# Patient Record
Sex: Male | Born: 1990 | Race: White | Hispanic: No | Marital: Single | Smoking: Never smoker
Health system: Southern US, Community
[De-identification: ages and names within clinical notes are randomized; demographics above are authoritative.]

## PROBLEM LIST (undated history)

## (undated) DIAGNOSIS — E785 Hyperlipidemia, unspecified: Secondary | ICD-10-CM

## (undated) DIAGNOSIS — L0292 Furuncle, unspecified: Secondary | ICD-10-CM

## (undated) DIAGNOSIS — E039 Hypothyroidism, unspecified: Secondary | ICD-10-CM

## (undated) DIAGNOSIS — Z0000 Encounter for general adult medical examination without abnormal findings: Secondary | ICD-10-CM

## (undated) DIAGNOSIS — Q909 Down syndrome, unspecified: Secondary | ICD-10-CM

## (undated) DIAGNOSIS — Q249 Congenital malformation of heart, unspecified: Secondary | ICD-10-CM

## (undated) DIAGNOSIS — L0293 Carbuncle, unspecified: Secondary | ICD-10-CM

## (undated) DIAGNOSIS — R5381 Other malaise: Secondary | ICD-10-CM

## (undated) DIAGNOSIS — R5383 Other fatigue: Secondary | ICD-10-CM

## (undated) HISTORY — PX: LUNG SURGERY: SHX703

## (undated) HISTORY — DX: Hypothyroidism, unspecified: E03.9

## (undated) HISTORY — DX: Hyperlipidemia, unspecified: E78.5

## (undated) HISTORY — DX: Other fatigue: R53.83

## (undated) HISTORY — DX: Down syndrome, unspecified: Q90.9

## (undated) HISTORY — DX: Carbuncle, unspecified: L02.93

## (undated) HISTORY — DX: Encounter for general adult medical examination without abnormal findings: Z00.00

## (undated) HISTORY — PX: HERNIA REPAIR: SHX51

## (undated) HISTORY — DX: Furuncle, unspecified: L02.92

## (undated) HISTORY — DX: Congenital malformation of heart, unspecified: Q24.9

## (undated) HISTORY — PX: CARDIAC SURGERY: SHX584

## (undated) HISTORY — DX: Other malaise: R53.81

---
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
## 2010-02-07 ENCOUNTER — Encounter: Payer: Self-pay | Admitting: Family Medicine

## 2010-03-13 ENCOUNTER — Ambulatory Visit: Payer: Self-pay | Admitting: Family Medicine

## 2010-03-19 ENCOUNTER — Ambulatory Visit: Payer: Self-pay | Admitting: Family Medicine

## 2010-03-22 ENCOUNTER — Ambulatory Visit: Payer: Self-pay | Admitting: Family Medicine

## 2010-03-22 DIAGNOSIS — J020 Streptococcal pharyngitis: Secondary | ICD-10-CM | POA: Insufficient documentation

## 2010-04-02 ENCOUNTER — Ambulatory Visit: Payer: Self-pay | Admitting: Family Medicine

## 2010-04-02 DIAGNOSIS — R5381 Other malaise: Secondary | ICD-10-CM | POA: Insufficient documentation

## 2010-04-02 DIAGNOSIS — R5383 Other fatigue: Secondary | ICD-10-CM

## 2010-04-02 HISTORY — DX: Other malaise: R53.81

## 2010-06-24 ENCOUNTER — Encounter: Payer: Self-pay | Admitting: Family Medicine

## 2010-06-26 ENCOUNTER — Telehealth: Payer: Self-pay | Admitting: Family Medicine

## 2010-07-03 NOTE — Assessment & Plan Note (Signed)
Summary: boil//ccm   Vital Signs:  Patient profile:   20 year old male Height:      59.5 inches Weight:      146 pounds BMI:     29.10 Temp:     98.7 degrees F oral BP sitting:   102 / 72  (left arm) Cuff size:   regular  Vitals Entered By: Kern Reap CMA Duncan Dull) (August 07, 2009 8:52 AM)  Reason for Visit boin on inner left thigh  History of Present Illness: Grant [name] is an 20 year old male, who comes in today accompanied by his mother Grant [name] for evaluation of boils on the underside of his left leg x 2 weeks.  She noticed this about two weeks ago.  Recently, the beginning, more red more swollen.  There are not draining pus.  No systemic symptoms  Allergies: No Known Drug Allergies  Past History:  Past medical, surgical, family and social histories (including risk factors) reviewed for relevance to current acute and chronic problems.  Past Medical History: Reviewed history from 04/01/2008 and no changes required. Pneumonia, hx of Down's syndrome  Past Surgical History: Reviewed history from 04/01/2008 and no changes required. Inguinal herniorrhaphy Tonsillectomy open-heart surgery at [age] year of age.  Gore-Tex graft in the ventricle details to follow circumcision facial surgery for severe laceration lung abscess 2000 6 requiring decortication and chest tube at [facility]  Family History: Reviewed history from 04/01/2008 and no changes required. mother in good health.  No problems except for penicillin allergy father in good health one sister, who is autistic  Social History: Reviewed history from 04/01/2008 and no changes required. Single Never Smoked Alcohol use-no Drug use-no Regular exercise-no freshman at [employer]  Review of Systems      See HPI  Physical Exam  General:  Well-developed,well-nourished,in no acute distress; alert,appropriate and cooperative throughout examination Skin:  boils x 3, inner side, left thigh   Impression &  Recommendations:  Problem # 1:  CARBUNCLE AND FURUNCLE OF UNSPECIFIED SITE (ICD-680.9) Assessment New  Orders: Prescription Created Electronically [phone])  Complete Medication List: 1)  Tamiflu 45 Mg Caps (Oseltamivir phosphate) .... Every morning x 7 days 2)  Keflex 500 Mg Caps (Cephalexin) .... 2 by mouth two times a day  Patient Instructions: 1)  begin Keflex 2 tabs  b.i.d. x 2 weeks.  If the lesions do not resolve, then let me know.   2)  cover in the evening with antibiotic ointment  and occlusive dressing Prescriptions: KEFLEX 500 MG CAPS (CEPHALEXIN) 2 by mouth two times a day  #56 x 1   Entered and Authorized by:   Roderick Pee MD   Signed by:   Roderick Pee MD on 08/07/2009   Method used:   Electronically to        [facility]* (retail)       [address] [place], [place]  [address]       Ph: [phone]       Fax: [phone]   RxID:   [id number]

## 2010-07-03 NOTE — Miscellaneous (Signed)
Summary: Order for Speech and Language Therapy/[employer]  Order for Speech and Language Therapy/[employer]   Imported By: Maryln Gottron 02/13/2010 12:43:21  _____________________________________________________________________  External Attachment:    Type:   Image     Comment:   External Document

## 2010-07-03 NOTE — Assessment & Plan Note (Signed)
Summary: flu shot/njr  Nurse Visit   Allergies: No Known Drug Allergies  Immunizations Administered:  Influenza Vaccine # 1:    Vaccine Type: Fluvax 3+    Site: right deltoid    Mfr: GlaxoSmithKline    Dose: 0.5 ml    Route: IM    Given by: Mervin Hack CMA (AAMA)    Exp. Date: 12/01/2010    Lot #: [id number]    VIS given: 12/26/09 version given March 13, 2010.  Flu Vaccine Consent Questions:    Do you have a history of severe allergic reactions to this vaccine? no    Any prior history of allergic reactions to egg and/or gelatin? no    Do you have a sensitivity to the preservative Thimersol? no    Do you have a past history of Guillan-Barre Syndrome? no    Do you currently have an acute febrile illness? no    Have you ever had a severe reaction to latex? no    Vaccine information given and explained to patient? yes  Orders Added: 1)  Flu Vaccine 25yrs + [90658] 2)  Admin 1st Vaccine [09811]

## 2010-07-03 NOTE — Assessment & Plan Note (Signed)
Summary: SORE THROAT // RS   Vital Signs:  Patient profile:   20 year old male Weight:      135 pounds Temp:     97.8 degrees F oral  Vitals Entered By: Kern Reap CMA Duncan Dull) (March 22, 2010 8:15 AM) CC: follow-up visit sore throat   CC:  follow-up visit sore throat.  History of Present Illness: Grant [name] is a 20 year old male, who comes in today accompanied by his mom for evaluation of a sore throat.  About a week ago he developed a febrile illness.  The fever lasted for a day or two and went away.  We saw him on Monday.  At that time.  His exam was negative.  Yesterday at school.  He became listless and mom picked him up early.  He said no fever, and is not complaining of anything.  Allergies: No Known Drug Allergies  Social History: Reviewed history from 04/01/2008 and no changes required. Single Never Smoked Alcohol use-no Drug use-no Regular exercise-no freshman at [employer]  Review of Systems      See HPI  Physical Exam  General:  Well-developed,well-nourished,in no acute distress; alert,appropriate and cooperative throughout examination Head:  Normocephalic and atraumatic without obvious abnormalities. No apparent alopecia or balding. Eyes:  No corneal or conjunctival inflammation noted. EOMI. Perrla. Funduscopic exam benign, without hemorrhages, exudates or papilledema. Vision grossly normal. Ears:  External ear exam shows no significant lesions or deformities.  Otoscopic examination reveals clear canals, tympanic membranes are intact bilaterally without bulging, retraction, inflammation or discharge. Hearing is grossly normal bilaterally. Nose:  External nasal examination shows no deformity or inflammation. Nasal mucosa are pink and moist without lesions or exudates. Mouth:  oral cavity normal.  There is erythema of the soft palate extending to both tonsils.  No pus Neck:  No deformities, masses, or tenderness noted. Lungs:  Normal respiratory  effort, chest expands symmetrically. Lungs are clear to auscultation, no crackles or wheezes.   Problems:  Medical Problems Added: 1)  Dx of Strep Throat  (ICD-034.0)  Impression & Recommendations:  Problem # 1:  STREP THROAT (ICD-034.0) Assessment New  Orders: Prescription Created Electronically [phone])  His updated medication list for this problem includes:    Amoxicillin 250 Mg/12ml Susr (Amoxicillin) .Marland Kitchen... 2 tsps in am, 1 tsp in pm  Complete Medication List: 1)  Hydromet 5-1.5 Mg/51ml Syrp (Hydrocodone-homatropine) .... 1/2 to 1 tsp at bedtime as needed 2)  Amoxicillin 250 Mg/15ml Susr (Amoxicillin) .... 2 tsps in am, 1 tsp in pm  Other Orders: Rapid Strep (13086)  Patient Instructions: 1)  amoxicillin 250 mg/teaspoon, directions 2 teaspoons in the morning, one at bedtime to bottle empty Prescriptions: AMOXICILLIN 250 MG/5ML SUSR (AMOXICILLIN) 2 tsps in am, 1 tsp in pm  #150 cc x 1   Entered and Authorized by:   Roderick Pee MD   Signed by:   Roderick Pee MD on 03/22/2010   Method used:   Electronically to        [facility]* (retail)       [address] [place], [place]  [address]       Ph: [phone]       Fax: [phone]   RxID:   [id number]    Orders Added: 1)  Rapid Strep [87564] 2)  Est. Patient Level II [33295] 3)  Prescription Created Electronically [G8553] 4)  Est. Patient Level III [16109]    Laboratory Results  Date/Time Received: March 22, 2010   Other Tests  Rapid Strep: positive Comments: Kern Reap CMA Duncan Dull)  March 22, 2010 8:25 AM

## 2010-07-03 NOTE — Letter (Signed)
Summary: Out of School  [place] [facility]  [address] [place], [place] [address]   Phone: [phone]  Fax: [phone]    April 02, 2010   Student:  Grant [name]    To Whom It May Concern:   For Medical reasons, please excuse the above named student from school for the following dates:  Start:   April 02, 2010  End:     If you need additional information, please feel free to contact our office.   Sincerely,    Kelle Darting, MD    ****This is a legal document and cannot be tampered with.  Schools are authorized to verify all information and to do so accordingly.

## 2010-07-03 NOTE — Assessment & Plan Note (Signed)
Summary: chest congestion/stuff nose/cjr   Vital Signs:  Patient profile:   20 year old male Height:      59 inches Weight:      143 pounds BMI:     28.99 Temp:     98.3 degrees F oral BP sitting:   102 / 72  (left arm)  Vitals Entered By: Kern Reap CMA Duncan Dull) (March 19, 2010 3:39 PM) CC: head congestion and sore throat   CC:  head congestion and sore throat.  History of Present Illness: Grant [name] is an 20 year old male with underlying Down syndrome, who comes in today with a 4-day history of fever, chills, and congestion.  Fever one away after 72 hours.  He is now afebrile feels normal.  Allergies: No Known Drug Allergies  Past History:  Past medical, surgical, family and social histories (including risk factors) reviewed for relevance to current acute and chronic problems.  Past Medical History: Reviewed history from 04/01/2008 and no changes required. Pneumonia, hx of Down's syndrome  Past Surgical History: Reviewed history from 04/01/2008 and no changes required. Inguinal herniorrhaphy Tonsillectomy open-heart surgery at [age] year of age.  Gore-Tex graft in the ventricle details to follow circumcision facial surgery for severe laceration lung abscess 2000 6 requiring decortication and chest tube at [facility]  Family History: Reviewed history from 04/01/2008 and no changes required. mother in good health.  No problems except for penicillin allergy father in good health one sister, who is autistic  Social History: Reviewed history from 04/01/2008 and no changes required. Single Never Smoked Alcohol use-no Drug use-no Regular exercise-no freshman at [employer]  Physical Exam  General:  Well-developed,well-nourished,in no acute distress; alert,appropriate and cooperative throughout examination Head:  Normocephalic and atraumatic without obvious abnormalities. No apparent alopecia or balding. Eyes:  No corneal or conjunctival inflammation  noted. EOMI. Perrla. Funduscopic exam benign, without hemorrhages, exudates or papilledema. Vision grossly normal. Ears:  External ear exam shows no significant lesions or deformities.  Otoscopic examination reveals clear canals, tympanic membranes are intact bilaterally without bulging, retraction, inflammation or discharge. Hearing is grossly normal bilaterally. Nose:  External nasal examination shows no deformity or inflammation. Nasal mucosa are pink and moist without lesions or exudates. Mouth:  Oral mucosa and oropharynx without lesions or exudates.  Teeth in good repair. Neck:  No deformities, masses, or tenderness noted. Chest Wall:  No deformities, masses, tenderness or gynecomastia noted. Breasts:  No masses or gynecomastia noted Lungs:  Normal respiratory effort, chest expands symmetrically. Lungs are clear to auscultation, no crackles or wheezes.   Problems:  Medical Problems Added: 1)  Dx of Viral Infection-unspec  (ICD-079.99)  Impression & Recommendations:  Problem # 1:  VIRAL INFECTION-UNSPEC (ICD-079.99) Assessment New  His updated medication list for this problem includes:    Hydromet 5-1.5 Mg/36ml Syrp (Hydrocodone-homatropine) .Marland Kitchen... 1/2 to 1 tsp at bedtime as needed  Orders: Rapid Strep (16109)  Complete Medication List: 1)  Hydromet 5-1.5 Mg/90ml Syrp (Hydrocodone-homatropine) .... 1/2 to 1 tsp at bedtime as needed  Patient Instructions: 1)  Hydromet one half teaspoons nightly p.r.n. cough, cold, return p.r.n. Prescriptions: HYDROMET 5-1.5 MG/5ML SYRP (HYDROCODONE-HOMATROPINE) 1/2 to 1 tsp at bedtime as needed  #8oz x 1   Entered and Authorized by:   Roderick Pee MD   Signed by:   Roderick Pee MD on 03/19/2010   Method used:   Print then Give to Patient   RxID:   [id number]    Orders Added: 1)  Est.  Patient Level III [16109] 2)  Rapid Strep [60454]     Laboratory Results  Date/Time Received: March 19, 2010   Other Tests  Rapid Strep:  negative Comments: Kern Reap CMA Duncan Dull)  March 19, 2010 4:16 PM

## 2010-07-03 NOTE — Assessment & Plan Note (Signed)
Summary: LOSS OF APPETITE / LETHARGY // RS   Vital Signs:  Patient profile:   20 year old male Weight:      133 pounds BP sitting:   110 / 76  (left arm) Cuff size:   regular  Vitals Entered By: Kern Reap CMA Duncan Dull) (April 02, 2010 9:39 AM) CC: not eating    CC:  not eating .  History of Present Illness: Grant [name] is an 20 year old male, who is followed in by his mother Grant [name] for evaluation of lethargy.  No energy.  We saw him on October 20 with strep throat.  We placed him on 10 days of amoxicillin, which she finished last Friday.  He's been lethargic.  No energy, decreased appetite, however, no fever.  Review of systems otherwise negative  Allergies: No Known Drug Allergies  Past History:  Past medical, surgical, family and social histories (including risk factors) reviewed for relevance to current acute and chronic problems.  Past Medical History: Reviewed history from 04/01/2008 and no changes required. Pneumonia, hx of Down's syndrome  Past Surgical History: Reviewed history from 04/01/2008 and no changes required. Inguinal herniorrhaphy Tonsillectomy open-heart surgery at [age] year of age.  Gore-Tex graft in the ventricle details to follow circumcision facial surgery for severe laceration lung abscess 2000 6 requiring decortication and chest tube at [facility]  Family History: Reviewed history from 04/01/2008 and no changes required. mother in good health.  No problems except for penicillin allergy father in good health one sister, who is autistic  Social History: Reviewed history from 04/01/2008 and no changes required. Single Never Smoked Alcohol use-no Drug use-no Regular exercise-no freshman at [employer]  Review of Systems      See HPI  Physical Exam  General:  Well-developed,well-nourished,in no acute distress; alert,appropriate and cooperative throughout examination Head:  Normocephalic and atraumatic without obvious  abnormalities. No apparent alopecia or balding. Eyes:  No corneal or conjunctival inflammation noted. EOMI. Perrla. Funduscopic exam benign, without hemorrhages, exudates or papilledema. Vision grossly normal. Ears:  External ear exam shows no significant lesions or deformities.  Otoscopic examination reveals clear canals, tympanic membranes are intact bilaterally without bulging, retraction, inflammation or discharge. Hearing is grossly normal bilaterally. Nose:  External nasal examination shows no deformity or inflammation. Nasal mucosa are pink and moist without lesions or exudates. Mouth:  Oral mucosa and oropharynx without lesions or exudates.  Teeth in good repair. Neck:  No deformities, masses, or tenderness noted. Chest Wall:  No deformities, masses, tenderness or gynecomastia noted. Lungs:  Normal respiratory effort, chest expands symmetrically. Lungs are clear to auscultation, no crackles or wheezes. Abdomen:  Bowel sounds positive,abdomen soft and non-tender without masses, organomegaly or hernias noted.   Impression & Recommendations:  Problem # 1:  STREP THROAT (ICD-034.0) Assessment Improved  His updated medication list for this problem includes:    Amoxicillin 250 Mg/76ml Susr (Amoxicillin) .Marland Kitchen... 2 tsps in am, 1 tsp in pm  Problem # 2:  FATIGUE (ICD-780.79) Assessment: New  Complete Medication List: 1)  Hydromet 5-1.5 Mg/108ml Syrp (Hydrocodone-homatropine) .... 1/2 to 1 tsp at bedtime as needed 2)  Amoxicillin 250 Mg/15ml Susr (Amoxicillin) .... 2 tsps in am, 1 tsp in pm  Patient Instructions: 1)  Please schedule a follow-up appointment as needed.   Orders Added: 1)  Est. Patient Level IV [04540]

## 2010-07-05 NOTE — Progress Notes (Signed)
Summary: referral  Phone Note Call from Patient Call back at [phone]   Caller: Patient Call For: Roderick Pee MD Summary of Call: Pt has medicaid needs referral to see dr Gerlene Burdock [facility] for complete eye exam Initial call taken by: Heron Sabins,  June 26, 2010 9:51 AM  Follow-up for Phone Call        oki Follow-up by: Roderick Pee MD,  June 26, 2010 11:28 AM

## 2010-09-20 ENCOUNTER — Telehealth: Payer: Self-pay | Admitting: Family Medicine

## 2010-09-20 MED ORDER — SULFAMETHOXAZOLE-TMP DS 800-160 MG PO TABS: 1.0000 | ORAL_TABLET | Freq: Two times a day (BID) | ORAL | Status: AC

## 2010-09-20 MED ORDER — DOXYCYCLINE HYCLATE 100 MG PO TABS: 100.0000 mg | ORAL_TABLET | Freq: Two times a day (BID) | ORAL | Status: AC

## 2010-09-20 NOTE — Telephone Encounter (Signed)
Mom says that her son has a boil that keeps resurfacing and in the past he was just given an abx without an ov. Please advise mom. She uses [facility].

## 2010-10-19 NOTE — Op Note (Signed)
[facility]  Patient:    Grant [name], Grant [name]                    MRN: [id number] Proc. Date: 12/11/99 Adm. Date:  [id number] Attending:  Lubertha South                           Operative Report  PREOPERATIVE DIAGNOSIS:  Displaced left supracondylar elbow fracture.  POSTOPERATIVE DIAGNOSIS:  Displaced comminuted left supracondylar elbow fracture.  OPERATION:  Closed manipulation and pinning of left supracondylar humerus fracture with two 0.0625 K-wires, one medial and one lateral epicondyle.  SURGEON:  Kerrin Champagne, M.D.  ANESTHESIA:  General orotracheal, Dr. Michelle Piper.  ESTIMATED BLOOD LOSS:  10 cc.  DRAINS:  None.  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  He has been followed by his pediatrician and is in good health with irregular heart function.  He has had previous surgery for facial laceration a year and a half ago and for tibia fracture some 3-4 years ago.  Today he fell about 1:30 p.m. from monkey bars, landing on his left elbow and sustaining a left supracondylar elbow fracture, which is displaced posteriorly and comminuted. He is brought to the operating room to undergo closed manipulation with pinning versus open reduction and internal fixation.  INTRAOPERATIVE FINDINGS:  The patient was found to have a transverse supracondylar elbow fracture with minimal comminution present.  Fracture reduced nicely under closed manipulation and then underwent pinning using crossed K-wires from the medial epicondyle across both the lateral and medial pillars, fixing the distal humerus fracture fragment to the proximal humerus extra-articularly.  DESCRIPTION OF OPERATION:  After adequate general anesthesia, the patients left upper extremity was manipulated closed using  C-arm fluoro to ascertain if reduction was going to be possible.  This was done by performing a longitudinal type traction on the left upper extremity using the proximal portion of the forearm in an olecranon pin type traction manner in order to provide traction in attempts at reducing the fracture, placing the arm in slight extension, allowing the fracture to reduce and then flexing.  Observing on C-arm fluoro the fracture reduced, extending out to length.  Both medial and lateral columns could then be reduced.  With this then, the left upper extremity was prepped from the wrist to the upper arm with Dura-Prep solution and drape in the usual manner and C-arm fluoro was kept beneath the drape. Under C-arm fluoro, the fracture again was reduced, palpating the lateral epicondyle of the humerus. Observed on the AP to be well reduced, the pin was then introduced across the lateral epicondyle into the lateral pillar obliquely, fixing this.  Then with the elbow in external rotation, the medial epicondyle was identified and cubital tunnel was identified and carefully palpated It was evident the ulnar nerve was well placed within the cubital tunnel and that a pin could be inserted into the medial epicondyle as it was quite prominent.  With the arm held in external rotation, a lateral view of the distal humerus was seen and a pin was then inserted into the epicondyle medially and then passed obliquely into the medial pillar of the distal humerus.  On the AP view the pin was noted to be in excellent position alignment,  fixing the medial epicondyle to the medial pillar.  Both pins engaged the cortex and the humerus above the pillars, both medial and lateral, again providing excellent fixation.  Permanent AP and lateral view was obtained then using flat plates, again demonstrating good reduction in good position alignment of the supracondylar humerus fracture with pins crossing and fixing the  supracondylar humerus fracture in good position alignment. Pins were then bent and clipped, allowing them to be outside of the skin. Xeroform gauze was applied to the skin at the pin skin margin and then 2 x 2s then held in place with sterile Webril.  A well-padded posterior splint was then applied removing all dressing and all Webril material from the anterior aspect of the elbow to prevent compression here.  Following this, an opening was made over the radial artery area, over the volar radial aspect of the wrist for continued neurovascular checks here.  Patient demonstrated excellent capillary refill and an excellent radial artery pulse at the end of the procedure.  The patient was then reactivated and returned to the recovery room in satisfactory condition.  All instrument and sponge counts were correct.  He will be admitted overnight for neurovascular checks. DD:  12/11/99 TD:  12/11/99 Job: [id number]

## 2010-10-19 NOTE — Op Note (Signed)
. [facility]  Patient:    Grant [name]                     MRN: [id number] Proc. Date: 01/03/00 Adm. Date:  [id number] Disc. Date: [id number] Attending:  Lubertha South                           Operative Report  PREOPERATIVE DIAGNOSIS:  Healing left supracondylar humerus fracture with retained pins times two.  POSTOPERATIVE DIAGNOSIS:  Healing left supracondylar humerus fracture with retained pins times two.  OPERATION:  Removal of buried K wires times two, left elbow.  SURGEON:  Kerrin Champagne, M.D.  ASSISTANT:  Ralene Bathe, P.A.  ANESTHESIA:  General via face mask  ANESTHESIOLOGIST:  Maren Beach, M.D.  ESTIMATED BLOOD LOSS: 10 cc  DRAINS:  None.  BRIEF CLINICAL HISTORY:  The patient is an 20-year-old male with a history of Downs syndrome. He fell on December 11, 1999 from monkey bars sustaining a displaced left supracondylar humerus fracture. He was taken to the emergency room and then to the operating room where he underwent a closed reduction and cross K wire pinning of the supracondylar humerus fracture.  He tolerated the procedure well. He has been in a long arm splint since that time for approximately three weeks.  The radiographs have demonstrated gradual progressive healing with formation of a callous over the fracture site.  He was brought back to the operating room to undergo a removal of his pins as the pins have become buried and the patient basically is not capable of understanding removal of the wires and requires controlled environment.  INTRAOPERATIVE FINDINGS:  The fracture site was found to be quite stable with flexion and extension.  The elbow could be flexed to approximately 120 degrees. Extension was to about 30 degrees short of full extension.  He showed the normal carrying angle about 15 degrees.  Both K wires had become buried. The lateral K wire was easily removed after being able to slide the skin  over the pin.  The medial K wire was more difficult because of the length of the bent portion of the pin, required cutting the pin beneath the skin and removing the pin.  DESCRIPTION OF PROCEDURE:  After adequate general anesthesia, the left upper extremity was prepped with Betadine scrub, prep and solution.  The lateral K wire was then easily removed using a needle driver through the skin stab incisions that had been present and removing it easily.  Medial pin was more difficult to remove.  The K wire could be easily be examined and obtained; however, because of the length of the bent pin, required cutting beneath the skin and then removal of the two portions of the pin that had been cut.  This was done then.  Xeroform gauze was then applied to the pin wound sites.  Then 4 x 4s. Sterile Webril applied and a well padded posterior splint, extending from just above the left wrist to the left upper arm was applied. The patient was then reactivated after obtaining samples of blood for thyroid testing that had been requested by Dr. Dario Guardian.  POSTOPERATIVE CARE OF THE PATIENT:  Will be seen back in the office in one week for removal of the splint, x-rays without splint and beginning three and four times daily range of motion of the elbow.  The patient will also be  covered perioperatively for prophylaxis versus endocarditis with Keflex. DD:  01/03/00 TD:  01/04/00 Job: [id number]

## 2010-10-19 NOTE — Op Note (Signed)
NAMEJAVID, Grant [name]             ACCOUNT NO.:  [id number]   MEDICAL RECORD NO.:  [id number]          PATIENT TYPE:  OIB   LOCATION:  6120                         FACILITY:  [facility]   PHYSICIAN:  Prabhakar D. Pendse, M.D.DATE OF BIRTH:  1991/01/01   DATE OF PROCEDURE:  01/14/2005  DATE OF DISCHARGE:                                 OPERATIVE REPORT   PREOPERATIVE DIAGNOSIS:  1.  Right indirect inguinal hernia.  2.  Redundant prepuce.  3.  Down's syndrome.  4.  Status post AV canal and PDA repairs.   POSTOPERATIVE DIAGNOSIS:  1.  Right indirect inguinal hernia.  2.  Redundant prepuce.  3.  Down's syndrome.  4.  Status post AV canal and PDA repairs.   OPERATION PERFORMED:  1.  Repair of right indirect inguinal hernia.  2.  Circumcision.   SURGEON:  Dr. Levie Heritage.   ASSISTANT:  Dr. Eber Hong.   ANESTHESIA:  Nurse.   OPERATIVE PROCEDURE:  Under satisfactory general anesthesia the patient in  supine position, abdominal and groin regions were thoroughly prepped and  draped in the usual manner. About 4 cm long transverse incision was made in  the right groin and distal skin crease. Skin and subcutaneous tissue  incised. Bleeders individually, cut and electrocoagulated. External oblique  opened. The spermatic cord structures were dissected to isolate the indirect  inguinal hernia sac. The sac was isolated up to its high point, doubly  suture ligated with 3-0 silk and excess of the sac was excised. Testicle  returned to the right scrotal pouch.  Hernia repair was done by modified  Ferguson's method with #4-0 wire interrupted sutures. Quarter percent  Marcaine with epinephrine was injected locally for postop analgesia.  Subcutaneous tissue apposed with 3-0 Vicryl, skin closed with 4-0 Monocryl  subcuticular sutures. Steri-Strips applied.   The patient's general condition being satisfactory, circumcision operation  was initiated. Circumferential incision was made over the distal aspect  of  the penis. Skin was undermined distally, prepuce was deferred after dorsal  slit incision. Mucosal incision was made about 3 mm from the coronal sulcus.  Redundant prepuce and mucosa were excised. Skin and mucosa were now  approximated with 4-0 chromic interrupted sutures. Quarter percent Marcaine  with epinephrine was injected locally for postop analgesia. Neosporin  dressing applied.  Throughout the procedure the patient's vital signs  remained stable. The patient withstood the procedure well and was  transferred to recovery room in satisfactory general condition.           ______________________________  Hyman Bible Levie Heritage, M.D.     PDP/MEDQ  D:  01/14/2005  T:  01/14/2005  Job:  [id number]   cc:   Dario Guardian, M.D.  Fax: [phone]

## 2010-10-19 NOTE — Discharge Summary (Signed)
Grant [name], Grant [name]             ACCOUNT NO.:  [id number]   MEDICAL RECORD NO.:  [id number]          PATIENT TYPE:  INP   LOCATION:  6120                         FACILITY:  [facility]   PHYSICIAN:  Broadus John T. Pickard II, MDDATE OF BIRTH:  May 27, 1991   DATE OF ADMISSION:  10/03/2004  DATE OF DISCHARGE:  10/07/2004                                 DISCHARGE SUMMARY   ATTENDING PHYSICIAN:  Henrietta Hoover, M.D.   PRIMARY CARE PHYSICIAN:  [facility].   HOSPITAL COURSE:  The patient is a 20 year old white male with Down syndrome  who was admitted with a right middle lobe/right lower lobe pneumonia,  parapneumonic effusion on Oct 03, 2004. The patient failed outpatient  antibiotic therapy twice. Mom was concerned and returned to her PCP's office  secondary to anorexia and weight loss. Chest x-ray done Oct 03, 2004  confirmed right middle lobe and right lower lobe pneumonia and significant  parapneumonic effusion. The patient was admitted for IV antibiotics and to  monitor clinically for symptomatic improvement. The patient was started on  ceftriaxone 1 gram IV daily. This was continued from Wednesday through  Friday. Status post three days of IV antibiotics, the patient's p.o.  appetite improved tremendously. He was up and in the playroom. On Oct 06, 2004, the patient was switched to p.o. Augmentin. The patient tolerated p.o.  augment without difficulty x24 hours at the time of discharge. Appetite  remained stable and the patient was playful and waiting to go home on the  day of discharge. The patient remained afebrile throughout the course of  hospitalization and had no O2 requirement throughout the course of the  hospitalization.   OPERATIONS AND PROCEDURES:  The patient had a chest x-ray on Oct 03, 2004  that showed a right middle lobe and right lower lobe pneumonia with  parapneumonic effusion concerning for possible empyema.   DISCHARGE MEDICATIONS:  Augmentin 875 milligrams p.o.  b.i.d. x18 days. This  will complete a 21-day course for total therapy of the pneumonia and  parapneumonic effusion.   FOLLOW-UP:  The patient is instructed to call his PCP and arrange follow-up  next week to assess resolution of pneumonia and tolerance of the p.o.  antibiotic as well as p.o. diet.      WTP/MEDQ  D:  10/07/2004  T:  10/07/2004  Job:  [id number]

## 2011-03-04 ENCOUNTER — Ambulatory Visit (INDEPENDENT_AMBULATORY_CARE_PROVIDER_SITE_OTHER): Payer: Self-pay | Admitting: Family Medicine

## 2011-03-04 DIAGNOSIS — Z23 Encounter for immunization: Secondary | ICD-10-CM

## 2011-11-21 ENCOUNTER — Ambulatory Visit (INDEPENDENT_AMBULATORY_CARE_PROVIDER_SITE_OTHER): Payer: Medicaid Other | Admitting: Family Medicine

## 2011-11-21 ENCOUNTER — Encounter: Payer: Self-pay | Admitting: Family Medicine

## 2011-11-21 DIAGNOSIS — Q909 Down syndrome, unspecified: Secondary | ICD-10-CM

## 2011-11-21 DIAGNOSIS — Z0000 Encounter for general adult medical examination without abnormal findings: Secondary | ICD-10-CM

## 2011-11-21 DIAGNOSIS — L0293 Carbuncle, unspecified: Secondary | ICD-10-CM

## 2011-11-21 DIAGNOSIS — Q249 Congenital malformation of heart, unspecified: Secondary | ICD-10-CM

## 2011-11-21 DIAGNOSIS — L0292 Furuncle, unspecified: Secondary | ICD-10-CM

## 2011-11-21 MED ORDER — CEPHALEXIN 250 MG/5ML PO SUSR: ORAL | Status: DC

## 2011-11-21 NOTE — Patient Instructions (Addendum)
Keflex 2 teaspoons twice daily when necessary for boils or acne  Return one year sooner if any problems

## 2011-11-21 NOTE — Progress Notes (Signed)
  Subjective:    Patient ID: Grant [name], male    DOB: [birth year], 21 y.o.   MRN: [id number]  HPI Grant [name] is a 21 year old male single nonsmoker with underlying Down's syndrome who comes in today accompanied by his mom [name] for annual physical examination  She states she's had a good year doing well no major complications. He does have some issues with boils and acne.  She states he is allergic to amoxicillin but on further inquiry he did not develop hives or any airway problem. She states that cause CNS side effects confusion and it took a year for that to resolve. I question whether he is really allergic. Mom has a history of penicillin allergy   Review of Systems  Constitutional: Negative.   HENT: Negative.   Eyes: Negative.   Respiratory: Negative.   Cardiovascular: Negative.   Gastrointestinal: Negative.   Genitourinary: Negative.   Musculoskeletal: Negative.   Skin: Negative.   Neurological: Negative.   Hematological: Negative.   Psychiatric/Behavioral: Negative.        Objective:   Physical Exam  Constitutional: He is oriented to person, place, and time. He appears well-developed and well-nourished.  HENT:  Head: Normocephalic and atraumatic.  Right Ear: External ear normal.  Left Ear: External ear normal.  Nose: Nose normal.  Mouth/Throat: Oropharynx is clear and moist.  Eyes: Conjunctivae and EOM are normal. Pupils are equal, round, and reactive to light.  Neck: Normal range of motion. Neck supple. No JVD present. No tracheal deviation present. No thyromegaly present.  Cardiovascular: Normal rate, regular rhythm, normal heart sounds and intact distal pulses.  Exam reveals no gallop and no friction rub.   No murmur heard. Pulmonary/Chest: Effort normal and breath sounds normal. No stridor. No respiratory distress. He has no wheezes. He has no rales. He exhibits no tenderness.  Abdominal: Soft. Bowel sounds are normal. He exhibits no distension and no mass. There is  no tenderness. There is no rebound and no guarding.  Genitourinary: Penis normal.  Musculoskeletal: Normal range of motion. He exhibits no edema and no tenderness.  Lymphadenopathy:    He has no cervical adenopathy.  Neurological: He is alert and oriented to person, place, and time. He has normal reflexes. No cranial nerve deficit. He exhibits normal muscle tone.  Skin: Skin is warm and dry. No rash noted. No erythema. No pallor.       3 boils on his back silver dollar size  Psychiatric: He has a normal mood and affect. His behavior is normal. Judgment and thought content normal.          Assessment & Plan:  Healthy male  Down's syndrome  Boils on his back Keflex 500 twice a day is

## 2012-12-23 ENCOUNTER — Telehealth: Payer: Self-pay | Admitting: Family Medicine

## 2012-12-23 NOTE — Telephone Encounter (Signed)
Caller: Jodie/Mother; Phone: [phone]; Reason for Call: Call regarding Boil flare up on Right Inner Thigh.  Mom states Dr Tawanna Cooler has written PRN Keflex RX for her son due to history of Boils in that area.  RX expired on 6-20.  Triage and appt offered.  Mom denied. Last OV was 11-2011 per [facility].  Mom would like RX to be refilled and sent to [facility], info in [place].  PLEASE REVIEW W/ MD AND F/U W/ OFFICE.

## 2012-12-24 ENCOUNTER — Other Ambulatory Visit: Payer: Self-pay | Admitting: Family Medicine

## 2012-12-24 DIAGNOSIS — L0293 Carbuncle, unspecified: Secondary | ICD-10-CM

## 2012-12-24 MED ORDER — CEPHALEXIN 250 MG/5ML PO SUSR: ORAL | Status: DC

## 2012-12-24 NOTE — Telephone Encounter (Signed)
Done by Dr Tawanna Cooler

## 2013-01-28 ENCOUNTER — Telehealth: Payer: Self-pay | Admitting: Family Medicine

## 2013-01-28 NOTE — Telephone Encounter (Signed)
Please contact mother to schedule flu vaccine.

## 2013-01-28 NOTE — Telephone Encounter (Signed)
Spoke with Mom

## 2013-04-22 ENCOUNTER — Ambulatory Visit (INDEPENDENT_AMBULATORY_CARE_PROVIDER_SITE_OTHER): Payer: Medicaid Other | Admitting: *Deleted

## 2013-04-22 DIAGNOSIS — Z23 Encounter for immunization: Secondary | ICD-10-CM

## 2014-11-17 ENCOUNTER — Telehealth: Payer: Self-pay | Admitting: Family Medicine

## 2014-11-17 NOTE — Telephone Encounter (Signed)
Pt father called in due to pt having down syndrome. He states that he is only in town on specific days and the days that I offered are not going to work. Pt father wanted to see if he could be worked in another way and would like to be contacted being that he is the one that handles his appointment. Pt father lives out of town.

## 2014-11-17 NOTE — Telephone Encounter (Signed)
Patient is no longer Dr Nelida Meuse patient and he does not accept the insurance.  Mom is aware.

## 2014-12-02 ENCOUNTER — Other Ambulatory Visit: Payer: Self-pay | Admitting: Adult Health

## 2014-12-02 ENCOUNTER — Encounter: Payer: Medicaid Other | Admitting: Adult Health

## 2014-12-02 ENCOUNTER — Encounter: Payer: Self-pay | Admitting: Adult Health

## 2014-12-02 ENCOUNTER — Ambulatory Visit (INDEPENDENT_AMBULATORY_CARE_PROVIDER_SITE_OTHER): Payer: Managed Care, Other (non HMO) | Admitting: Adult Health

## 2014-12-02 ENCOUNTER — Telehealth: Payer: Self-pay | Admitting: Adult Health

## 2014-12-02 VITALS — BP 110/80 | HR 82 | Temp 97.3°F | Ht 59.06 in | Wt 177.0 lb

## 2014-12-02 DIAGNOSIS — R7989 Other specified abnormal findings of blood chemistry: Secondary | ICD-10-CM | POA: Diagnosis not present

## 2014-12-02 DIAGNOSIS — L219 Seborrheic dermatitis, unspecified: Secondary | ICD-10-CM | POA: Diagnosis not present

## 2014-12-02 DIAGNOSIS — E039 Hypothyroidism, unspecified: Secondary | ICD-10-CM

## 2014-12-02 DIAGNOSIS — E785 Hyperlipidemia, unspecified: Secondary | ICD-10-CM

## 2014-12-02 DIAGNOSIS — Z0000 Encounter for general adult medical examination without abnormal findings: Secondary | ICD-10-CM | POA: Diagnosis not present

## 2014-12-02 DIAGNOSIS — Q249 Congenital malformation of heart, unspecified: Secondary | ICD-10-CM

## 2014-12-02 DIAGNOSIS — Z23 Encounter for immunization: Secondary | ICD-10-CM | POA: Diagnosis not present

## 2014-12-02 LAB — BASIC METABOLIC PANEL
BUN: 17 mg/dL (ref 6–23)
CALCIUM: 9.2 mg/dL (ref 8.4–10.5)
CHLORIDE: 103 meq/L (ref 96–112)
CO2: 27 mEq/L (ref 19–32)
CREATININE: 1.05 mg/dL (ref 0.40–1.50)
GFR: 92.59 mL/min (ref >60.00)
Glucose, Bld: 87 mg/dL (ref 70–99)
POTASSIUM: 4.2 meq/L (ref 3.5–5.1)
Sodium: 136 mEq/L (ref 135–145)

## 2014-12-02 LAB — LIPID PANEL
CHOL/HDL RATIO: 10
Cholesterol: 258 mg/dL — ABNORMAL HIGH (ref 0–200)
HDL: 24.6 mg/dL — AB (ref >39.00)
Triglycerides: 1095 mg/dL — ABNORMAL HIGH (ref 0.0–149.0)

## 2014-12-02 LAB — LDL CHOLESTEROL, DIRECT: Direct LDL: 73 mg/dL

## 2014-12-02 LAB — CBC WITH DIFFERENTIAL/PLATELET
BASOS ABS: 0 10*3/uL (ref 0.0–0.1)
BASOS PCT: 0.5 % (ref 0.0–3.0)
EOS ABS: 0.1 10*3/uL (ref 0.0–0.7)
Eosinophils Relative: 2.5 % (ref 0.0–5.0)
HEMATOCRIT: 43.8 % (ref 39.0–52.0)
HEMOGLOBIN: 15 g/dL (ref 13.0–17.0)
Lymphocytes Relative: 31 % (ref 12.0–46.0)
Lymphs Abs: 1.7 10*3/uL (ref 0.7–4.0)
MCHC: 34.2 g/dL (ref 30.0–36.0)
MCV: 90.8 fl (ref 78.0–100.0)
MONO ABS: 0.4 10*3/uL (ref 0.1–1.0)
Monocytes Relative: 7 % (ref 3.0–12.0)
NEUTROS ABS: 3.2 10*3/uL (ref 1.4–7.7)
Neutrophils Relative %: 59 % (ref 43.0–77.0)
PLATELETS: 286 10*3/uL (ref 150.0–400.0)
RBC: 4.83 Mil/uL (ref 4.22–5.81)
RDW: 15.6 % — AB (ref 11.5–15.5)
WBC: 5.4 10*3/uL (ref 4.0–10.5)

## 2014-12-02 LAB — HEMOGLOBIN A1C: HEMOGLOBIN A1C: 5.1 % (ref 4.6–6.5)

## 2014-12-02 LAB — TSH: TSH: 5.89 u[IU]/mL — AB (ref 0.35–4.50)

## 2014-12-02 LAB — HEPATIC FUNCTION PANEL
ALBUMIN: 4.1 g/dL (ref 3.5–5.2)
ALK PHOS: 67 U/L (ref 39–117)
ALT: 15 U/L (ref 0–53)
AST: 20 U/L (ref 0–37)
Bilirubin, Direct: 0.1 mg/dL (ref 0.0–0.3)
TOTAL PROTEIN: 7.7 g/dL (ref 6.0–8.3)
Total Bilirubin: 0.6 mg/dL (ref 0.2–1.2)

## 2014-12-02 MED ORDER — KETOCONAZOLE 2 % EX SHAM: 1.0000 "application " | MEDICATED_SHAMPOO | CUTANEOUS | Status: DC

## 2014-12-02 MED ORDER — ACETONE (URINE) TEST VI STRP: 1.0000 | ORAL_STRIP | Status: DC | PRN

## 2014-12-02 MED ORDER — LEVOTHYROXINE SODIUM 25 MCG PO TABS: 25.0000 ug | ORAL_TABLET | Freq: Every day | ORAL | Status: DC

## 2014-12-02 NOTE — Progress Notes (Signed)
Pre visit review using our clinic review tool, if applicable. No additional management support is needed unless otherwise documented below in the visit note. 

## 2014-12-02 NOTE — Telephone Encounter (Signed)
Left VM on patient's mother phone about starting Synthroid and then getting a new lipid panel. I will follow up on Tuesday

## 2014-12-02 NOTE — Progress Notes (Signed)
Subjective:    Patient ID: Grant [name], male    DOB: Aug 20, 1990, 24 y.o.   MRN: [id number]  HPI Grant [name] is a 24 year old male single nonsmoker with underlying Down's syndrome who comes in today accompanied by his father Grant [name] for annual physical.   His last physical was in 2013. 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 He does not exerice as much as his father would like due to exercise intolerance.  Has no other complaints   Review of Systems   Constitutional: Negative.  HENT: Negative.  Eyes: Negative.  Respiratory: Negative.  Cardiovascular: Negative.  Gastrointestinal: Negative.  Genitourinary: Negative.  Musculoskeletal: Negative.  Skin: Negative.  Neurological: Negative.  Hematological: Negative.  Psychiatric/Behavioral: Negative.  No past medical history on file.  History   Social History  . Marital Status: Married    Spouse Name: N/A  . Number of Children: N/A  . Years of Education: N/A   Occupational History  . Not on file.   Social History Main Topics  . Smoking status: Not on file  . Smokeless tobacco: Not on file  . Alcohol Use: Not on file  . Drug Use: Not on file  . Sexual Activity: Not on file   Other Topics Concern  . Not on file   Social History Narrative  . No narrative on file    No past surgical history on file.  No family history on file.  Allergies  Allergen Reactions  . Codeine   . Doxycycline   . Penicillins     Current Outpatient Prescriptions on File Prior to Visit  Medication Sig Dispense Refill  . cephALEXin (KEFLEX) 250 MG/5ML suspension 2 teaspoons twice a day when necessary for boils or acne (Patient not taking: Reported on 12/02/2014) 200 mL 3   No current facility-administered medications on file prior to visit.    BP 110/80 mmHg  Pulse 82  Temp(Src) 97.3 F (36.3  C) (Oral)  Ht 4' 11.06" (1.5 m)  Wt 177 lb (80.287 kg)  BMI 35.68 kg/m2      Objective:   Physical Exam  Constitutional: He is oriented to person, place, and time. He appears  well-nourished.  HENT:  Head: Normocephalic and atraumatic.  Right Ear: External ear normal.  Left Ear: External ear normal.  Nose: Nose normal.  Mouth/Throat: Oropharynx is clear and moist.  Eyes: Conjunctivae and EOM are normal. Pupils are equal, round, and reactive to light.  Neck: Normal range of motion. Neck supple. No JVD present. No tracheal deviation present. No thyromegaly present.  Cardiovascular: Normal rate, regular rhythm, normal heart sounds and intact distal pulses. Exam reveals no gallop and no friction rub.  No murmur heard. Pulmonary/Chest: Effort normal and breath sounds normal. No stridor. No respiratory distress. He has no wheezes. He has no rales. He exhibits no tenderness.  Abdominal: Soft. Bowel sounds are normal. He exhibits no distension and no mass. There is no tenderness. There is no rebound and no guarding.  Genitourinary: Penis normal. Testicles normal. No masses or hernias  Musculoskeletal: Normal range of motion. He exhibits no edema and no tenderness.  Lymphadenopathy:   He has no cervical adenopathy.  Neurological: He is alert and oriented to person, place, and time. He has normal reflexes. No cranial nerve deficit. He exhibits normal muscle tone.  Skin: Skin is warm and dry. No rash noted. No erythema. No pallor. No boils noticed in groin. Has dandruff, no rash present.  Psychiatric: He has a normal mood and affect. His behavior is normal. Judgment and thought content normal.      Assessment & Plan:  1. Routine general medical examination at a health care facility - Follow up in one year for CPE - Follow up as needed - Work on diet and continue to exercise.  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 Ace Gins was his Cardiologist in the past and Donalds father would like to go see him again.  - Ambulatory referral to Pediatric Cardiology  3. Need for Tdap vaccination - Tdap vaccine greater than or equal to 7yo IM  4. Seborrheic dermatitis - ketoconazole (NIZORAL) 2 % shampoo; Apply 1 application topically 2 (two) times a week.  Dispense: 120 mL; Refill: 6

## 2014-12-02 NOTE — Patient Instructions (Addendum)
It was great meeting you today!   I will follow up with you regarding labs.   Please use the shampoo twice a week. If there is no improvement in the next 2 weeks, please let me know.   You can follow up in one year for your next physical or sooner if needed.  Health Maintenance - 18-24 Years Old SCHOOL PERFORMANCE After high school, you may attend college or technical or vocational school, enroll in the [employer], or enter the workforce. PHYSICAL, SOCIAL, AND EMOTIONAL DEVELOPMENT  One hour of regular physical activity daily is recommended. Continue to participate in sports.  Develop your own interests and consider community service or volunteerism.  Make decisions about college and work plans.  Throughout these years, you should assume responsibility for your own health care. Increasing independence is important for you.  You may be exploring your sexual identity. Understand that you should never be in a situation that makes you feel uncomfortable, and tell your partner if you do not want to engage in sexual activity.  Body image may become important to you. Be mindful that eating disorders can develop at this time. Talk to your parents or other caregivers if you have concerns about body image, weight gain, or losing weight.  You may notice mood disturbances, depression, anxiety, attention problems, or trouble with alcohol. Talk to your health care provider if you have concerns about mental illness.  Set limits for yourself and talk with your parents or other caregivers about independent decision making.  Handle conflict without physical violence.  Avoid loud noises which may impair hearing.  Limit television and computer time to 2 hours each day. Individuals who engage in excessive inactivity are more likely to become overweight. RECOMMENDED IMMUNIZATIONS  Influenza vaccine.  All adults should be immunized every year.  All adults, including pregnant women and people with  hives-only allergy to eggs, can receive the inactivated influenza (IIV) vaccine.  Adults aged 18-49 years can receive the recombinant influenza (RIV) vaccine. The RIV vaccine does not contain any egg protein.  Tetanus, diphtheria, and acellular pertussis (Td, Tdap) vaccine.  Pregnant women should receive 1 dose of Tdap vaccine during each pregnancy. The dose should be obtained regardless of the length of time since the last dose. Immunization is preferred during the 27th to 36th week of gestation.  An adult who has not previously received Tdap or who does not know his or her vaccine status should receive 1 dose of Tdap. This initial dose should be followed by tetanus and diphtheria toxoids (Td) booster doses every 10 years.  Adults with an unknown or incomplete history of completing a 3-dose immunization series with Td-containing vaccines should begin or complete a primary immunization series including a Tdap dose.  Adults should receive a Td booster every 10 years.  Varicella vaccine.  An adult without evidence of immunity to varicella should receive 2 doses or a second dose if he or she has previously received 1 dose.  Pregnant females who do not have evidence of immunity should receive the first dose after pregnancy. This first dose should be obtained before leaving the health care facility. The second dose should be obtained 4-8 weeks after the first dose.  Human papillomavirus (HPV) vaccine.  Females aged 13-26 years who have not received the vaccine previously should obtain the 3-dose series.  The vaccine is not recommended for pregnant females. However, pregnancy testing is not needed before receiving a dose. If a male is found to be pregnant  after receiving a dose, no treatment is needed. In that case, the remaining doses should be delayed until after the pregnancy.  Males aged 62-21 years who have not received the vaccine previously should receive the 3-dose series. Males aged  22-26 years may be immunized.  Immunization is recommended through the age of 43 years for any male who has sex with males and did not get any or all doses earlier.  Immunization is recommended for any person with an immunocompromised condition through the age of 59 years if he or she did not get any or all doses earlier.  During the 3-dose series, the second dose should be obtained 4-8 weeks after the first dose. The third dose should be obtained 24 weeks after the first dose and 16 weeks after the second dose.  Measles, mumps, and rubella (MMR) vaccine.  Adults born in [birth year] or later should have 1 or more doses of MMR vaccine unless there is a contraindication to the vaccine or there is laboratory evidence of immunity to each of the three diseases.  A routine second dose of MMR vaccine should be obtained at least 28 days after the first dose for students attending postsecondary schools, health care workers, and international travelers.  For females of childbearing age, rubella immunity should be determined. If there is no evidence of immunity, females who are not pregnant should be vaccinated. If there is no evidence of immunity, females who are pregnant should delay immunization until after pregnancy.  Pneumococcal 13-valent conjugate (PCV13) vaccine.  When indicated, a person who is uncertain of his or her immunization history and has no record of immunization should receive the PCV13 vaccine.  An adult aged 70 years or older who has certain medical conditions and has not been previously immunized should receive 1 dose of PCV13 vaccine. This PCV13 should be followed with a dose of pneumococcal polysaccharide (PPSV23) vaccine. The PPSV23 vaccine dose should be obtained at least 8 weeks after the dose of PCV13 vaccine.  An adult aged 39 years or older who has certain medical conditions and previously received 1 or more doses of PPSV23 vaccine should receive 1 dose of PCV13. The PCV13 vaccine  dose should be obtained 1 or more years after the last PPSV23 vaccine dose.  Pneumococcal polysaccharide (PPSV23) vaccine.  When PCV13 is also indicated, PCV13 should be obtained first.  An adult younger than age 47 years who has certain medical conditions should be immunized.  Any person who resides in a long-term care facility should be immunized.  An adult smoker should be immunized.  People with an immunocompromised condition and certain other conditions should receive both PCV13 and PPSV23 vaccines.  People with human immunodeficiency virus (HIV) infection should be immunized as soon as possible after diagnosis.  Immunization during chemotherapy or radiation therapy should be avoided.  Routine use of PPSV23 vaccine is not recommended for American Indians, [place] Natives, or people younger than 65 years unless there are medical conditions that require PPSV23 vaccine.  When indicated, people who have unknown immunization and have no record of immunization should receive PPSV23 vaccine.  One-time revaccination 5 years after the first dose of PPSV23 is recommended for people aged 19-64 years who have chronic kidney failure, nephrotic syndrome, asplenia, or immunocompromised conditions.  Meningococcal vaccine.  Adults with asplenia or persistent complement component deficiencies should receive 2 doses of quadrivalent meningococcal conjugate (MenACWY-D) vaccine. The doses should be obtained at least 2 months apart.  Microbiologists working with certain meningococcal bacteria,  [place] recruits, people at risk during an outbreak, and people who travel to or live in countries with a high rate of meningitis should be immunized.  A first-year college student up through age 14 years who is living in a residence hall should receive a dose if he or she did not receive a dose on or after his or her 16th birthday.  Adults who have certain high-risk conditions should receive one or more doses of  vaccine.  Hepatitis A vaccine.  Adults who wish to be protected from this disease, have certain high-risk conditions, work with hepatitis A-infected animals, work in hepatitis A research labs, or travel to or work in countries with a high rate of hepatitis A should be immunized.  Adults who were previously unvaccinated and who anticipate close contact with an international adoptee during the first 60 days after arrival in the [place] from a country with a high rate of hepatitis A should be immunized.  Hepatitis B vaccine.  Adults who wish to be protected from this disease, have certain high-risk conditions, may be exposed to blood or other infectious body fluids, are household contacts or sex partners of hepatitis B positive people, are clients or workers in certain care facilities, or travel to or work in countries with a high rate of hepatitis B should be immunized.  Haemophilus influenzae type b (Hib) vaccine.  A previously unvaccinated person with asplenia or sickle cell disease or having a scheduled splenectomy should receive 1 dose of Hib vaccine.  Regardless of previous immunization, a recipient of a hematopoietic stem cell transplant should receive a 3-dose series 6-12 months after his or her successful transplant.  Hib vaccine is not recommended for adults with HIV infection. TESTING  Annual screening for vision and hearing problems is recommended. Vision should be screened at least once between 50-20 years of age.  You may be screened for anemia or tuberculosis.  You should have a blood test to check for high cholesterol.  You should be screened for alcohol and drug use.  If you are sexually active, you may be screened for sexually transmitted infections (STIs), pregnancy, or HIV. You should be screened for STIs if:  Your sexual activity has changed since the last screening test, and you are at an increased risk for chlamydia or gonorrhea. Ask your health care provider  if you are at risk.  If you are at an increased risk for hepatitis B, you should be screened for this virus. You are considered at high risk for hepatitis B if you:  Were born in a country where hepatitis B occurs often. Talk with your health care provider about which countries are considered high risk.  Have parents who were born in a high-risk country and have not received a shot to protect against hepatitis B (hepatitis B vaccine).  Have HIV or AIDS.  Use needles to inject street drugs.  Live with or have sex with someone who has hepatitis B.  Are a man who has sex with other men (MSM).  Get hemodialysis treatment.  Take certain medicines for conditions like cancer, organ transplantation, or autoimmune conditions. NUTRITION   You should:  Have three servings of low-fat milk and dairy products daily. If you do not drink milk or consume dairy products, you should eat calcium-enriched foods, such as juice, bread, or cereal. Dark, leafy greens or canned fish are alternate sources of calcium.  Drink plenty of water. Fruit juice should be limited to 8-12 oz (240-360 mL)  each day. Sugary beverages and sodas should be avoided.  Avoid eating foods high in fat, salt, or sugar, such as chips, candy, and cookies.  Avoid fast foods and limit eating out at restaurants.  Try not to skip meals, especially breakfast. You should eat a variety of vegetables, fruits, and lean meats.  Eat meals together as a family whenever possible. ORAL HEALTH Brush your teeth twice a day and floss at least once a day. You should have two dental exams a year.  SKIN CARE You should wear sunscreen when out in the sun. TALK TO SOMEONE ABOUT:  Precautions against pregnancy, contraception, and sexually transmitted infections.  Taking a prescription medicine daily to prevent HIV infection if you are at risk of being infected with HIV. This is called preexposure prophylaxis (PrEP). You are at risk if you:  Are a  male who has sex with other males (MSM).  Are heterosexual and sexually active with more than one partner.  Take drugs by injection.  Are sexually active with a partner who has HIV.  Whether you are at high risk of being infected with HIV. If you choose to begin PrEP, you should first be tested for HIV. You should then be tested every 3 months for as long as you are taking PrEP.  Drug, tobacco, and alcohol use among your friends or at friends' homes. Smoking tobacco or marijuana and taking drugs have health consequences and may impact your brain development.  Appropriate use of over-the-counter or prescription medicines.  Driving guidelines and riding with friends.  The risks of drinking and driving or boating. Call someone if you have been drinking or using drugs and need a ride. WHAT'S NEXT? Visit your pediatrician or family physician once a year. By young adulthood, you should transition from your pediatrician to a family physician or internal medicine specialist. If you are a male and are sexually active, you may want to begin annual physical exams with a gynecologist. Document Released: 08/15/2006 Document Revised: 05/25/2013 Document Reviewed: 09/04/2006 [facility] Patient Information 2015 [place], [place]. This information is not intended to replace advice given to you by your health care provider. Make sure you discuss any questions you have with your health care provider.

## 2014-12-06 ENCOUNTER — Telehealth: Payer: Self-pay | Admitting: Adult Health

## 2014-12-06 ENCOUNTER — Other Ambulatory Visit: Payer: Self-pay | Admitting: Adult Health

## 2014-12-06 DIAGNOSIS — E039 Hypothyroidism, unspecified: Secondary | ICD-10-CM

## 2014-12-06 NOTE — Telephone Encounter (Signed)
Attempted to call pt home number is busy.

## 2014-12-06 NOTE — Telephone Encounter (Signed)
I tried to get a hold of his mother a few times on Friday about lab results. Can you try and get a hold of her?   1. His triglycerides are over 1000, normal is below 150. I would like him to come back this week after fasting for 12 hours to see if this was a mistake 2. His labs showed that he has hypothyroidism, i started him on a low dose of Synthroid. He needs to come back in 6 weeks to have this re checked.   Thanks,

## 2014-12-07 ENCOUNTER — Telehealth: Payer: Self-pay | Admitting: Adult Health

## 2014-12-07 ENCOUNTER — Other Ambulatory Visit: Payer: Self-pay | Admitting: Adult Health

## 2014-12-07 ENCOUNTER — Other Ambulatory Visit (INDEPENDENT_AMBULATORY_CARE_PROVIDER_SITE_OTHER): Payer: Managed Care, Other (non HMO)

## 2014-12-07 DIAGNOSIS — E039 Hypothyroidism, unspecified: Secondary | ICD-10-CM

## 2014-12-07 DIAGNOSIS — E785 Hyperlipidemia, unspecified: Secondary | ICD-10-CM

## 2014-12-07 LAB — LIPID PANEL
CHOLESTEROL: 246 mg/dL — AB (ref 0–200)
HDL: 25 mg/dL — ABNORMAL LOW (ref >39.00)
Total CHOL/HDL Ratio: 10
Triglycerides: 1101 mg/dL — ABNORMAL HIGH (ref 0.0–149.0)

## 2014-12-07 LAB — LDL CHOLESTEROL, DIRECT: Direct LDL: 78 mg/dL

## 2014-12-07 LAB — TSH: TSH: 6.29 u[IU]/mL — ABNORMAL HIGH (ref 0.35–4.50)

## 2014-12-07 MED ORDER — ATORVASTATIN CALCIUM 20 MG PO TABS: 20.0000 mg | ORAL_TABLET | Freq: Every day | ORAL | Status: DC

## 2014-12-07 NOTE — Telephone Encounter (Signed)
I spoke to the patient's mother at length guarding her son's triglyceride and thyroid levels. I informed her that her son's triglyceride levels were over 1000 and that her son's thyroid was underactive. I advised her that the best course of action currently would be to start him on Lipitor and a low dose of Synthroid. She was not receptive to this due to the fact that "I have been on the wrong end of many medical issues regarding my son and daughter. I have read all the literature and I understand about vaccinations but I still believe that my daughter became autistic after receiving the MMR vaccination. I do not know what these medications consist of and I do not know the side effects of these medications. I have heard a lot of bad things about statin medications I am not sure that I want him to go on." We discussed at length side effects of statin medications as well as side effects of Synthroid. Which time she stated "I just do not trust the medicines". She was then advised of potential complications including heart disease, stroke, and heart attack with having triglycerides this elevated. She is also counseled on potential complications of hypothyroidism including, a goiter and additional heart problems.  After about 10 minutes of explaining side effects and potential complications of these diseases, she was agreeable to start these medications.  I also advised her that her son needs to start on heart healthy low-cholesterol diet and he needs to exercise as much as he can with his Down syndrome.  We will follow-up of lab results and 68 weeks for the thyroid. I will check his lipid profile in 6 months. 

## 2014-12-07 NOTE — Telephone Encounter (Signed)
Spoke with pt's mother and she is aware of pt's results  [facility] a statin medication.

## 2014-12-07 NOTE — Telephone Encounter (Signed)
Spoke to patient's mother on the phone regarding her son's Triglyceride and Thyroid levels at length. She was told that his Trigyceride levels were over 1000 and that it would be best to start him on a statin medication.

## 2014-12-07 NOTE — Telephone Encounter (Signed)
Left a message for mother to return call 

## 2014-12-08 ENCOUNTER — Other Ambulatory Visit (INDEPENDENT_AMBULATORY_CARE_PROVIDER_SITE_OTHER): Payer: Managed Care, Other (non HMO)

## 2014-12-08 DIAGNOSIS — E039 Hypothyroidism, unspecified: Secondary | ICD-10-CM | POA: Diagnosis not present

## 2014-12-08 DIAGNOSIS — I519 Heart disease, unspecified: Principal | ICD-10-CM

## 2014-12-08 LAB — T3, FREE: T3, Free: 3.7 pg/mL (ref 2.3–4.2)

## 2014-12-08 LAB — T4, FREE: FREE T4: 0.82 ng/dL (ref 0.60–1.60)

## 2015-04-10 ENCOUNTER — Other Ambulatory Visit: Payer: Self-pay | Admitting: Family Medicine

## 2015-06-12 ENCOUNTER — Other Ambulatory Visit: Payer: Self-pay | Admitting: Family Medicine

## 2015-07-13 ENCOUNTER — Other Ambulatory Visit: Payer: Self-pay | Admitting: Family Medicine

## 2016-01-18 ENCOUNTER — Other Ambulatory Visit: Payer: Self-pay | Admitting: Family Medicine

## 2016-01-19 ENCOUNTER — Encounter: Payer: Self-pay | Admitting: Adult Health

## 2016-01-19 ENCOUNTER — Ambulatory Visit (INDEPENDENT_AMBULATORY_CARE_PROVIDER_SITE_OTHER): Payer: Managed Care, Other (non HMO) | Admitting: Adult Health

## 2016-01-19 VITALS — BP 110/62 | Temp 98.6°F | Ht 59.06 in | Wt 186.0 lb

## 2016-01-19 DIAGNOSIS — Z0000 Encounter for general adult medical examination without abnormal findings: Secondary | ICD-10-CM | POA: Diagnosis not present

## 2016-01-19 DIAGNOSIS — E785 Hyperlipidemia, unspecified: Secondary | ICD-10-CM | POA: Insufficient documentation

## 2016-01-19 HISTORY — DX: Encounter for general adult medical examination without abnormal findings: Z00.00

## 2016-01-19 NOTE — Patient Instructions (Addendum)
It was great seeing you again.   I will follow up after I get your results back.   We will adjust medications, if needed at that time.   Please let me know if you need anything  Have a great year  You have a lab appointment at 10:30 am on Monday   Health Maintenance, Male A healthy lifestyle and preventative care can promote health and wellness.  Maintain regular health, dental, and eye exams.  Eat a healthy diet. Foods like vegetables, fruits, whole grains, low-fat dairy products, and lean protein foods contain the nutrients you need and are low in calories. Decrease your intake of foods high in solid fats, added sugars, and salt. Get information about a proper diet from your health care provider, if necessary.  Regular physical exercise is one of the most important things you can do for your health. Most adults should get at least 150 minutes of moderate-intensity exercise (any activity that increases your heart rate and causes you to sweat) each week. In addition, most adults need muscle-strengthening exercises on 2 or more days a week.   Maintain a healthy weight. The body mass index (BMI) is a screening tool to identify possible weight problems. It provides an estimate of body fat based on height and weight. Your health care provider can find your BMI and can help you achieve or maintain a healthy weight. For males 20 years and older:  A BMI below 18.5 is considered underweight.  A BMI of 18.5 to 24.9 is normal.  A BMI of 25 to 29.9 is considered overweight.  A BMI of 30 and above is considered obese.  Maintain normal blood lipids and cholesterol by exercising and minimizing your intake of saturated fat. Eat a balanced diet with plenty of fruits and vegetables. Blood tests for lipids and cholesterol should begin at age 25 and be repeated every 5 years. If your lipid or cholesterol levels are high, you are over age 25, or you are at high risk for heart disease, you may need your  cholesterol levels checked more frequently.Ongoing high lipid and cholesterol levels should be treated with medicines if diet and exercise are not working.  If you smoke, find out from your health care provider how to quit. If you do not use tobacco, do not start.  Lung cancer screening is recommended for adults aged 55-80 years who are at high risk for developing lung cancer because of a history of smoking. A yearly low-dose CT scan of the lungs is recommended for people who have at least a 30-pack-year history of smoking and are current smokers or have quit within the past 15 years. A pack year of smoking is smoking an average of 1 pack of cigarettes a day for 1 year (for example, a 30-pack-year history of smoking could mean smoking 1 pack a day for 30 years or 2 packs a day for 15 years). Yearly screening should continue until the smoker has stopped smoking for at least 15 years. Yearly screening should be stopped for people who develop a health problem that would prevent them from having lung cancer treatment.  If you choose to drink alcohol, do not have more than 2 drinks per day. One drink is considered to be 12 oz (360 mL) of beer, 5 oz (150 mL) of wine, or 1.5 oz (45 mL) of liquor.  Avoid the use of street drugs. Do not share needles with anyone. Ask for help if you need support or instructions about  stopping the use of drugs.  High blood pressure causes heart disease and increases the risk of stroke. High blood pressure is more likely to develop in:  People who have blood pressure in the end of the normal range (100-139/85-89 mm Hg).  People who are overweight or obese.  People who are African American.  If you are 79-31 years of age, have your blood pressure checked every 3-5 years. If you are 67 years of age or older, have your blood pressure checked every year. You should have your blood pressure measured twice--once when you are at a hospital or clinic, and once when you are not at a  hospital or clinic. Record the average of the two measurements. To check your blood pressure when you are not at a hospital or clinic, you can use:  An automated blood pressure machine at a pharmacy.  A home blood pressure monitor.  If you are 8-55 years old, ask your health care provider if you should take aspirin to prevent heart disease.  Diabetes screening involves taking a blood sample to check your fasting blood sugar level. This should be done once every 3 years after age 47 if you are at a normal weight and without risk factors for diabetes. Testing should be considered at a younger age or be carried out more frequently if you are overweight and have at least 1 risk factor for diabetes.  Colorectal cancer can be detected and often prevented. Most routine colorectal cancer screening begins at the age of 35 and continues through age 40. However, your health care provider may recommend screening at an earlier age if you have risk factors for colon cancer. On a yearly basis, your health care provider may provide home test kits to check for hidden blood in the stool. A small camera at the end of a tube may be used to directly examine the colon (sigmoidoscopy or colonoscopy) to detect the earliest forms of colorectal cancer. Talk to your health care provider about this at age 56 when routine screening begins. A direct exam of the colon should be repeated every 5-10 years through age 42, unless early forms of precancerous polyps or small growths are found.  People who are at an increased risk for hepatitis B should be screened for this virus. You are considered at high risk for hepatitis B if:  You were born in a country where hepatitis B occurs often. Talk with your health care provider about which countries are considered high risk.  Your parents were born in a high-risk country and you have not received a shot to protect against hepatitis B (hepatitis B vaccine).  You have HIV or AIDS.  You  use needles to inject street drugs.  You live with, or have sex with, someone who has hepatitis B.  You are a man who has sex with other men (MSM).  You get hemodialysis treatment.  You take certain medicines for conditions like cancer, organ transplantation, and autoimmune conditions.  Hepatitis C blood testing is recommended for all people born from [birth year] through 1965 and any individual with known risk factors for hepatitis C.  Healthy men should no longer receive prostate-specific antigen (PSA) blood tests as part of routine cancer screening. Talk to your health care provider about prostate cancer screening.  Testicular cancer screening is not recommended for adolescents or adult males who have no symptoms. Screening includes self-exam, a health care provider exam, and other screening tests. Consult with your health care provider about  any symptoms you have or any concerns you have about testicular cancer.  Practice safe sex. Use condoms and avoid high-risk sexual practices to reduce the spread of sexually transmitted infections (STIs).  You should be screened for STIs, including gonorrhea and chlamydia if:  You are sexually active and are younger than 24 years.  You are older than 24 years, and your health care provider tells you that you are at risk for this type of infection.  Your sexual activity has changed since you were last screened, and you are at an increased risk for chlamydia or gonorrhea. Ask your health care provider if you are at risk.  If you are at risk of being infected with HIV, it is recommended that you take a prescription medicine daily to prevent HIV infection. This is called pre-exposure prophylaxis (PrEP). You are considered at risk if:  You are a man who has sex with other men (MSM).  You are a heterosexual man who is sexually active with multiple partners.  You take drugs by injection.  You are sexually active with a partner who has HIV.  Talk with  your health care provider about whether you are at high risk of being infected with HIV. If you choose to begin PrEP, you should first be tested for HIV. You should then be tested every 3 months for as long as you are taking PrEP.  Use sunscreen. Apply sunscreen liberally and repeatedly throughout the day. You should seek shade when your shadow is shorter than you. Protect yourself by wearing long sleeves, pants, a wide-brimmed hat, and sunglasses year round whenever you are outdoors.  Tell your health care provider of new moles or changes in moles, especially if there is a change in shape or color. Also, tell your health care provider if a mole is larger than the size of a pencil eraser.  A one-time screening for abdominal aortic aneurysm (AAA) and surgical repair of large AAAs by ultrasound is recommended for men aged 53-75 years who are current or former smokers.  Stay current with your vaccines (immunizations).   This information is not intended to replace advice given to you by your health care provider. Make sure you discuss any questions you have with your health care provider.   Document Released: 11/16/2007 Document Revised: 06/10/2014 Document Reviewed: 10/15/2010 Elsevier Interactive Patient Education [employer].

## 2016-01-19 NOTE — Progress Notes (Signed)
Subjective:    Patient ID: Grant HampshireDonald [name], male    DOB: 1990/07/05, 25 y.o.   MRN: [id number]  HPI  Patient presents for yearly preventative medicine examination. Grant HillDonald is a 25 year old male who  has no past medical history on file.  All immunizations and health maintenance protocols were reviewed with the patient and needed orders were placed.  Medication reconciliation, past medical history, social history, problem list and allergies were reviewed in detail with the patient  Goals were established with regard to weight loss, exercise, and  diet in compliance with medications. His mother reports that his diet has become someone more healthy. He is eating a lot of salmon and fruits. He walks the dog every morning for 30 minutes. Despite this he has gained 9 pounds in the last year.   During his physical last year he was started on Lipitor for a triglyceride level of 1100. He has done well on this medication  He has had a good year for the most part. Grant HillDonald is dealing with seasonal allergies and sleep disturbance currently. Per mom, he is staying up late and then sleeps until noon.   Wt Readings from Last 3 Encounters:  01/19/16 186 lb (84.4 kg)  12/02/14 177 lb (80.3 kg)  04/02/10 133 lb (60.3 kg) (36 %, Z= -0.36)*   * Growth percentiles are based on Down Syndrome (2-20 Years) data.    Review of Systems  Constitutional: Negative.   HENT: Negative.   Eyes: Negative.   Respiratory: Negative.   Cardiovascular: Negative.   Gastrointestinal: Negative.   Endocrine: Negative.   Genitourinary: Negative.   Musculoskeletal: Negative.   Skin: Negative.   Allergic/Immunologic: Negative.   Neurological: Negative.   Hematological: Negative.   Psychiatric/Behavioral: Negative.   All other systems reviewed and are negative.  Past Medical History:  Diagnosis Date  . Down's syndrome     Social History   Social History  . Marital status: Married    Spouse name: N/A  . Number of  children: N/A  . Years of education: N/A   Occupational History  . Not on file.   Social History Main Topics  . Smoking status: Never Smoker  . Smokeless tobacco: Not on file  . Alcohol use Not on file  . Drug use: Unknown  . Sexual activity: Not on file   Other Topics Concern  . Not on file   Social History Narrative  . No narrative on file    No past surgical history on file.  No family history on file.  Allergies  Allergen Reactions  . Codeine   . Doxycycline   . Penicillins     Current Outpatient Prescriptions on File Prior to Visit  Medication Sig Dispense Refill  . acetone, urine, test strip 1 strip by Does not apply route as needed for high blood sugar. 25 each 0  . atorvastatin (LIPITOR) 20 MG tablet TAKE 1 TABLET (20 MG TOTAL) BY MOUTH DAILY. 30 tablet 7  . cephALEXin (KEFLEX) 250 MG/5ML suspension 2 teaspoons twice a day when necessary for boils or acne 200 mL 3  . ketoconazole (NIZORAL) 2 % shampoo Apply 1 application topically 2 (two) times a week. 120 mL 6  . levothyroxine (SYNTHROID, LEVOTHROID) 25 MCG tablet TAKE 1 TABLET (25 MCG TOTAL) BY MOUTH DAILY BEFORE BREAKFAST. 30 tablet 5   No current facility-administered medications on file prior to visit.     There were no vitals taken for this visit.  Objective:   Physical Exam  Constitutional: He is oriented to person, place, and time. He appears well-developed and well-nourished. No distress.  Cardiovascular: Normal rate, regular rhythm, normal heart sounds and intact distal pulses.  Exam reveals no gallop and no friction rub.   No murmur heard. Pulmonary/Chest: Effort normal and breath sounds normal. No respiratory distress. He has no wheezes. He has no rales. He exhibits no tenderness.  Neurological: He is alert and oriented to person, place, and time.  Skin: Skin is warm and dry. No rash noted. He is not diaphoretic. No erythema. No pallor.  Psychiatric: He has a normal mood and affect. His  behavior is normal. Judgment and thought content normal.  Nursing note and vitals reviewed.     Assessment & Plan:  1. Routine general medical examination at a health care facility - encouraged heart healthy diet and more exercise - POCT Urinalysis Dipstick (Automated); Future - Basic metabolic panel; Future - Hemoglobin A1c; Future - Hepatic function panel; Future - Lipid panel; Future - TSH; Future - CBC with Differential/Platelet; Future - Follow up in one year or sooner if needed 2. Hyperlipidemia - POCT Urinalysis Dipstick (Automated); Future - Basic metabolic panel; Future - Hemoglobin A1c; Future - Hepatic function panel; Future - Lipid panel; Future - TSH; Future - CBC with Differential/Platelet; Future - Levels are likely to continue to be elevated. Will consider going up on Lipitor and adding Fenofibrate.   Shirline Freesory Candia Kingsbury, NP

## 2016-01-22 ENCOUNTER — Other Ambulatory Visit (INDEPENDENT_AMBULATORY_CARE_PROVIDER_SITE_OTHER): Payer: Managed Care, Other (non HMO)

## 2016-01-22 DIAGNOSIS — Z0000 Encounter for general adult medical examination without abnormal findings: Secondary | ICD-10-CM

## 2016-01-22 DIAGNOSIS — E785 Hyperlipidemia, unspecified: Secondary | ICD-10-CM | POA: Diagnosis not present

## 2016-01-22 LAB — CBC WITH DIFFERENTIAL/PLATELET
BASOS ABS: 0 10*3/uL (ref 0.0–0.1)
Basophils Relative: 0.7 % (ref 0.0–3.0)
EOS ABS: 0.1 10*3/uL (ref 0.0–0.7)
Eosinophils Relative: 2.3 % (ref 0.0–5.0)
HEMATOCRIT: 42.2 % (ref 39.0–52.0)
HEMOGLOBIN: 14.6 g/dL (ref 13.0–17.0)
LYMPHS PCT: 29.8 % (ref 12.0–46.0)
Lymphs Abs: 1.7 10*3/uL (ref 0.7–4.0)
MCHC: 34.6 g/dL (ref 30.0–36.0)
MCV: 90.9 fl (ref 78.0–100.0)
MONOS PCT: 7.5 % (ref 3.0–12.0)
Monocytes Absolute: 0.4 10*3/uL (ref 0.1–1.0)
Neutro Abs: 3.3 10*3/uL (ref 1.4–7.7)
Neutrophils Relative %: 59.7 % (ref 43.0–77.0)
PLATELETS: 249 10*3/uL (ref 150.0–400.0)
RBC: 4.64 Mil/uL (ref 4.22–5.81)
RDW: 15.4 % (ref 11.5–15.5)
WBC: 5.6 10*3/uL (ref 4.0–10.5)

## 2016-01-22 LAB — POC URINALSYSI DIPSTICK (AUTOMATED)
Bilirubin, UA: NEGATIVE
Glucose, UA: NEGATIVE
KETONES UA: NEGATIVE
Leukocytes, UA: NEGATIVE
Nitrite, UA: NEGATIVE
PROTEIN UA: NEGATIVE
RBC UA: NEGATIVE
SPEC GRAV UA: 1.025
UROBILINOGEN UA: 0.2
pH, UA: 5.5

## 2016-01-22 LAB — HEPATIC FUNCTION PANEL
ALK PHOS: 65 U/L (ref 39–117)
ALT: 30 U/L (ref 0–53)
AST: 18 U/L (ref 0–37)
Albumin: 4.2 g/dL (ref 3.5–5.2)
BILIRUBIN DIRECT: 0.1 mg/dL (ref 0.0–0.3)
Total Bilirubin: 0.6 mg/dL (ref 0.2–1.2)
Total Protein: 7.5 g/dL (ref 6.0–8.3)

## 2016-01-22 LAB — BASIC METABOLIC PANEL
BUN: 17 mg/dL (ref 6–23)
CALCIUM: 8.8 mg/dL (ref 8.4–10.5)
CO2: 28 meq/L (ref 19–32)
CREATININE: 0.98 mg/dL (ref 0.40–1.50)
Chloride: 102 mEq/L (ref 96–112)
GFR: 99.31 mL/min (ref >60.00)
GLUCOSE: 81 mg/dL (ref 70–99)
Potassium: 4 mEq/L (ref 3.5–5.1)
SODIUM: 139 meq/L (ref 135–145)

## 2016-01-22 LAB — HEMOGLOBIN A1C: HEMOGLOBIN A1C: 5.4 % (ref 4.6–6.5)

## 2016-01-22 LAB — LIPID PANEL
CHOL/HDL RATIO: 5
Cholesterol: 167 mg/dL (ref 0–200)
HDL: 31.5 mg/dL — AB (ref >39.00)
Triglycerides: 405 mg/dL — ABNORMAL HIGH (ref 0.0–149.0)

## 2016-01-22 LAB — LDL CHOLESTEROL, DIRECT: Direct LDL: 60 mg/dL

## 2016-01-22 LAB — TSH: TSH: 6.83 u[IU]/mL — AB (ref 0.35–4.50)

## 2016-01-23 ENCOUNTER — Telehealth: Payer: Self-pay | Admitting: Adult Health

## 2016-01-23 NOTE — Telephone Encounter (Signed)
Attempted to call patients mother, Scherry RanJodie. No answer and not able to leave message

## 2016-01-24 ENCOUNTER — Telehealth: Payer: Self-pay | Admitting: Adult Health

## 2016-01-24 MED ORDER — LEVOTHYROXINE SODIUM 50 MCG PO TABS: 50.0000 ug | ORAL_TABLET | Freq: Every day | ORAL | 3 refills | Status: DC

## 2016-01-24 MED ORDER — ATORVASTATIN CALCIUM 40 MG PO TABS: 40.0000 mg | ORAL_TABLET | Freq: Every day | ORAL | 3 refills | Status: DC

## 2016-01-24 NOTE — Telephone Encounter (Signed)
Spoke to patients mother Jodie about Kevonta's lab results. His total cholesterol and triglycerides had gone down tremendously. I will increase his Lipitor to 40 mg due to not being at goal yet.   I am also going to go up on his synthroid

## 2016-04-03 ENCOUNTER — Ambulatory Visit (INDEPENDENT_AMBULATORY_CARE_PROVIDER_SITE_OTHER): Payer: Managed Care, Other (non HMO) | Admitting: Adult Health

## 2016-04-03 ENCOUNTER — Encounter: Payer: Self-pay | Admitting: Adult Health

## 2016-04-03 DIAGNOSIS — L0293 Carbuncle, unspecified: Secondary | ICD-10-CM | POA: Diagnosis not present

## 2016-04-03 DIAGNOSIS — L0292 Furuncle, unspecified: Secondary | ICD-10-CM | POA: Diagnosis not present

## 2016-04-03 MED ORDER — CEPHALEXIN 250 MG/5ML PO SUSR: ORAL | 3 refills | Status: DC

## 2016-04-03 NOTE — Patient Instructions (Signed)
I have sent in Keflex oral solution for Grant [name]. This will take care of his boils.   Make sure he has good hygiene and using a daily powder may help

## 2016-04-03 NOTE — Progress Notes (Signed)
   Subjective:    Patient ID: Grant HampshireDonald Demary, male    DOB: 1990/07/25, 25 y.o.   MRN: [id number]  HPI  25 year old male who  has a past medical history of Down's syndrome. 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 [name] has been prescribed Keflex oral solution in the past and it is reported that this works well for him.    Review of Systems  Skin: Positive for wound.  All other systems reviewed and are negative.  Past Medical History:  Diagnosis Date  . Down's syndrome     Social History   Social History  . Marital status: Married    Spouse name: N/A  . Number of children: N/A  . Years of education: N/A   Occupational History  . Not on file.   Social History Main Topics  . Smoking status: Never Smoker  . Smokeless tobacco: Not on file  . Alcohol use Not on file  . Drug use: Unknown  . Sexual activity: Not on file   Other Topics Concern  . Not on file   Social History Narrative  . No narrative on file    Past Surgical History:  Procedure Laterality Date  . CARDIAC SURGERY     open heart as a newborn   . LUNG SURGERY      No family history on file.  Allergies  Allergen Reactions  . Codeine   . Doxycycline   . Penicillins     Current Outpatient Prescriptions on File Prior to Visit  Medication Sig Dispense Refill  . acetone, urine, test strip 1 strip by Does not apply route as needed for high blood sugar. 25 each 0  . atorvastatin (LIPITOR) 40 MG tablet Take 1 tablet (40 mg total) by mouth daily at 6 PM. 90 tablet 3  . ketoconazole (NIZORAL) 2 % shampoo Apply 1 application topically 2 (two) times a week. 120 mL 6  . levothyroxine (SYNTHROID, LEVOTHROID) 50 MCG tablet Take 1 tablet (50 mcg total) by mouth daily before breakfast. 90 tablet 3   No current facility-administered medications on file prior to visit.     BP 106/72   Temp 98.2 F (36.8 C) (Oral)   Ht 4\' 11"  (1.499 m)   Wt 181 lb 14.4 oz  (82.5 kg)   BMI 36.74 kg/m       Objective:   Physical Exam  Constitutional: He is oriented to person, place, and time. He appears well-developed and well-nourished. No distress.  Neurological: He is alert and oriented to person, place, and time.  Skin: Skin is warm and dry. He is not diaphoretic.  Multiple small carbuncles in groin. No drainage noted  Psychiatric: He has a normal mood and affect. His behavior is normal. Judgment and thought content normal.  Nursing note and vitals reviewed.     Assessment & Plan:  1. Carbuncle and furuncle - cephALEXin (KEFLEX) 250 MG/5ML suspension; 2 teaspoons twice a day when necessary for boils or acne  Dispense: 200 mL; Refill: 3 - Encouraged good personal hygiene  - Follow up if no improvement  Shirline Freesory Melony Tenpas, NP

## 2016-06-26 ENCOUNTER — Other Ambulatory Visit: Payer: Self-pay | Admitting: Adult Health

## 2016-06-26 ENCOUNTER — Ambulatory Visit (INDEPENDENT_AMBULATORY_CARE_PROVIDER_SITE_OTHER)
Admission: RE | Admit: 2016-06-26 | Discharge: 2016-06-26 | Disposition: A | Discharge status: Home / Self Care | Payer: Managed Care, Other (non HMO) | Source: Ambulatory Visit | Attending: Adult Health | Admitting: Adult Health

## 2016-06-26 ENCOUNTER — Ambulatory Visit (INDEPENDENT_AMBULATORY_CARE_PROVIDER_SITE_OTHER): Payer: Managed Care, Other (non HMO) | Admitting: Adult Health

## 2016-06-26 VITALS — BP 118/78 | HR 101 | Ht 59.0 in | Wt 189.8 lb

## 2016-06-26 DIAGNOSIS — J069 Acute upper respiratory infection, unspecified: Secondary | ICD-10-CM | POA: Diagnosis not present

## 2016-06-26 DIAGNOSIS — G4730 Sleep apnea, unspecified: Secondary | ICD-10-CM | POA: Diagnosis not present

## 2016-06-26 MED ORDER — LEVOFLOXACIN 500 MG PO TABS: 500.0000 mg | ORAL_TABLET | Freq: Every day | ORAL | 0 refills | Status: DC

## 2016-06-26 MED ORDER — PREDNISONE 10 MG PO TABS: ORAL_TABLET | ORAL | 0 refills | Status: DC

## 2016-06-26 NOTE — Progress Notes (Signed)
Subjective:    Patient ID: Grant [name], male    DOB: 10-23-1990, 26 y.o.   MRN: [id number]  HPI  26 year old male who  has a past medical history of Down's syndrome. He presents to the office today with his mother and caregiver for concern of sleep apnea. Per his mother, she is concerned that during the night Grant [name] is suffering from sleep apnea, for awhile now she has noticed that he is holding his breath at night and he is constantly fatigued throughout the day.  She also reports that over the last two or three weeks Grant [name] has been suffering from an upper respiratory infection. His symptoms include that of wheezing, semi productive cough, nasal drainage, sinus pain and pressure. His symptoms have been waxing and weaning.   She denies any fevers, n/v/d.    Review of Systems See HPI  Past Medical History:  Diagnosis Date  . Down's syndrome     Social History   Social History  . Marital status: Married    Spouse name: N/A  . Number of children: N/A  . Years of education: N/A   Occupational History  . Not on file.   Social History Main Topics  . Smoking status: Never Smoker  . Smokeless tobacco: Not on file  . Alcohol use Not on file  . Drug use: Unknown  . Sexual activity: Not on file   Other Topics Concern  . Not on file   Social History Narrative  . No narrative on file    Past Surgical History:  Procedure Laterality Date  . CARDIAC SURGERY     open heart as a newborn   . LUNG SURGERY      No family history on file.  Allergies  Allergen Reactions  . Codeine   . Doxycycline   . Penicillins     Current Outpatient Prescriptions on File Prior to Visit  Medication Sig Dispense Refill  . acetone, urine, test strip 1 strip by Does not apply route as needed for high blood sugar. 25 each 0  . atorvastatin (LIPITOR) 40 MG tablet Take 1 tablet (40 mg total) by mouth daily at 6 PM. 90 tablet 3  . ketoconazole (NIZORAL) 2 % shampoo Apply 1 application  topically 2 (two) times a week. 120 mL 6  . levothyroxine (SYNTHROID, LEVOTHROID) 50 MCG tablet Take 1 tablet (50 mcg total) by mouth daily before breakfast. 90 tablet 3  . cephALEXin (KEFLEX) 250 MG/5ML suspension 2 teaspoons twice a day when necessary for boils or acne (Patient not taking: Reported on 06/26/2016) 200 mL 3   No current facility-administered medications on file prior to visit.     BP 118/78   Pulse (!) 101   Ht 4\' 11"  (1.499 m)   Wt 189 lb 12.8 oz (86.1 kg)   SpO2 93%   BMI 38.33 kg/m       Objective:   Physical Exam  Constitutional: He is oriented to person, place, and time. He appears well-developed and well-nourished. No distress.  HENT:  Head: Normocephalic and atraumatic.  Right Ear: Hearing, tympanic membrane, external ear and ear canal normal.  Left Ear: Tympanic membrane and external ear normal. Decreased hearing is noted.  Nose: Mucosal edema and rhinorrhea present.  Mouth/Throat: Oropharynx is clear and moist. No oropharyngeal exudate.  Eyes: Conjunctivae and EOM are normal. Pupils are equal, round, and reactive to light. Right eye exhibits no discharge. Left eye exhibits no discharge. No scleral icterus.  Cardiovascular:  Normal rate, regular rhythm, normal heart sounds and intact distal pulses.  Exam reveals no gallop and no friction rub.   No murmur heard. Pulmonary/Chest: Effort normal. No respiratory distress. He has wheezes. He has rhonchi in the right lower field and the left lower field. He has no rales. He exhibits no tenderness.  Musculoskeletal: Normal range of motion. He exhibits no edema, tenderness or deformity.  Neurological: He is alert and oriented to person, place, and time. He has normal reflexes. He displays normal reflexes. No cranial nerve deficit. He exhibits normal muscle tone. Coordination normal.  Skin: Skin is warm and dry. No rash noted. He is not diaphoretic. No erythema. No pallor.  Psychiatric: He has a normal mood and affect.  His behavior is normal. Judgment and thought content normal.  Nursing note and vitals reviewed.     Assessment & Plan:  1. Acute upper respiratory infection - Will get chest x ray to r/o pneumonia  - DG Chest 2 View; Future - Will treat appropriately per x ay results.   2. Sleep apnea, unspecified type - Ambulatory referral to Pulmonology   Shirline Frees, NP

## 2016-06-27 ENCOUNTER — Telehealth: Payer: Self-pay | Admitting: Adult Health

## 2016-06-27 NOTE — Telephone Encounter (Signed)
° ° ° °  Pt Mom call to ask for a call back about pt xray results

## 2016-06-27 NOTE — Telephone Encounter (Signed)
Patient's mom notified of x-ray results and verbalized understanding.

## 2016-08-13 ENCOUNTER — Telehealth: Payer: Self-pay | Admitting: Adult Health

## 2016-08-13 NOTE — Telephone Encounter (Signed)
For what reason?   

## 2016-08-13 NOTE — Telephone Encounter (Signed)
Is this ok?

## 2016-08-13 NOTE — Telephone Encounter (Signed)
° ° ° ° ° °  Pt request a referral to see Dr Ermalinda BarriosEric Kraus   ENT

## 2016-08-14 NOTE — Telephone Encounter (Signed)
I called and left message for patient's mother - I notified her that we will need reason for ENT referral and advised her to call back as soon as possible. Will await return phone call.

## 2016-08-16 NOTE — Telephone Encounter (Signed)
I left 2nd voicemail for patient's mother to return phone call. I will try again later.

## 2016-08-21 NOTE — Telephone Encounter (Signed)
Third attempt to contact patient's mother and unable to reach. I will close note now per protocol and await call back.

## 2016-11-04 ENCOUNTER — Other Ambulatory Visit: Payer: Self-pay | Admitting: Family Medicine

## 2016-11-04 DIAGNOSIS — L219 Seborrheic dermatitis, unspecified: Secondary | ICD-10-CM

## 2016-11-04 NOTE — Telephone Encounter (Signed)
Last refill 12/02/14 and last office visit 06/26/16.  Okay to fill?

## 2016-11-05 NOTE — Telephone Encounter (Signed)
Ok to refill 

## 2017-01-22 ENCOUNTER — Ambulatory Visit (INDEPENDENT_AMBULATORY_CARE_PROVIDER_SITE_OTHER): Payer: Managed Care, Other (non HMO) | Admitting: Adult Health

## 2017-01-22 ENCOUNTER — Encounter: Payer: Self-pay | Admitting: Adult Health

## 2017-01-22 VITALS — BP 112/76 | Temp 97.6°F | Ht 59.5 in | Wt 184.0 lb

## 2017-01-22 DIAGNOSIS — Q249 Congenital malformation of heart, unspecified: Secondary | ICD-10-CM | POA: Diagnosis not present

## 2017-01-22 DIAGNOSIS — E039 Hypothyroidism, unspecified: Secondary | ICD-10-CM | POA: Diagnosis not present

## 2017-01-22 DIAGNOSIS — L219 Seborrheic dermatitis, unspecified: Secondary | ICD-10-CM | POA: Diagnosis not present

## 2017-01-22 DIAGNOSIS — E782 Mixed hyperlipidemia: Secondary | ICD-10-CM

## 2017-01-22 DIAGNOSIS — Z0000 Encounter for general adult medical examination without abnormal findings: Secondary | ICD-10-CM

## 2017-01-22 LAB — CBC WITH DIFFERENTIAL/PLATELET
BASOS ABS: 0 10*3/uL (ref 0.0–0.1)
BASOS PCT: 0.4 % (ref 0.0–3.0)
EOS ABS: 0.1 10*3/uL (ref 0.0–0.7)
Eosinophils Relative: 1.3 % (ref 0.0–5.0)
HEMATOCRIT: 48.3 % (ref 39.0–52.0)
Hemoglobin: 15.8 g/dL (ref 13.0–17.0)
LYMPHS ABS: 2.5 10*3/uL (ref 0.7–4.0)
LYMPHS PCT: 35.3 % (ref 12.0–46.0)
MCHC: 32.8 g/dL (ref 30.0–36.0)
MCV: 94.9 fl (ref 78.0–100.0)
MONOS PCT: 6.3 % (ref 3.0–12.0)
Monocytes Absolute: 0.4 10*3/uL (ref 0.1–1.0)
NEUTROS ABS: 4 10*3/uL (ref 1.4–7.7)
NEUTROS PCT: 56.7 % (ref 43.0–77.0)
PLATELETS: 242 10*3/uL (ref 150.0–400.0)
RBC: 5.09 Mil/uL (ref 4.22–5.81)
RDW: 15.5 % (ref 11.5–15.5)
WBC: 7 10*3/uL (ref 4.0–10.5)

## 2017-01-22 LAB — LIPID PANEL
CHOL/HDL RATIO: 5
CHOLESTEROL: 135 mg/dL (ref 0–200)
HDL: 27.1 mg/dL — ABNORMAL LOW (ref >39.00)
NONHDL: 108.28
TRIGLYCERIDES: 337 mg/dL — AB (ref 0.0–149.0)
VLDL: 67.4 mg/dL — ABNORMAL HIGH (ref 0.0–40.0)

## 2017-01-22 LAB — HEPATIC FUNCTION PANEL
ALT: 26 U/L (ref 0–53)
AST: 24 U/L (ref 0–37)
Albumin: 4.6 g/dL (ref 3.5–5.2)
Alkaline Phosphatase: 74 U/L (ref 39–117)
BILIRUBIN DIRECT: 0.1 mg/dL (ref 0.0–0.3)
TOTAL PROTEIN: 8 g/dL (ref 6.0–8.3)
Total Bilirubin: 0.9 mg/dL (ref 0.2–1.2)

## 2017-01-22 LAB — HEMOGLOBIN A1C: Hgb A1c MFr Bld: 5.5 % (ref 4.6–6.5)

## 2017-01-22 LAB — TSH: TSH: 11.56 u[IU]/mL — AB (ref 0.35–4.50)

## 2017-01-22 LAB — BASIC METABOLIC PANEL
BUN: 12 mg/dL (ref 6–23)
CHLORIDE: 103 meq/L (ref 96–112)
CO2: 25 meq/L (ref 19–32)
CREATININE: 1.15 mg/dL (ref 0.40–1.50)
Calcium: 9.8 mg/dL (ref 8.4–10.5)
GFR: 81.9 mL/min (ref >60.00)
GLUCOSE: 94 mg/dL (ref 70–99)
Potassium: 4.1 mEq/L (ref 3.5–5.1)
Sodium: 141 mEq/L (ref 135–145)

## 2017-01-22 LAB — T3, FREE: T3 FREE: 3.1 pg/mL (ref 2.3–4.2)

## 2017-01-22 LAB — LDL CHOLESTEROL, DIRECT: Direct LDL: 56 mg/dL

## 2017-01-22 LAB — T4, FREE: FREE T4: 0.74 ng/dL (ref 0.60–1.60)

## 2017-01-22 MED ORDER — KETOCONAZOLE 2 % EX SHAM: 1.0000 "application " | MEDICATED_SHAMPOO | CUTANEOUS | 5 refills | Status: AC

## 2017-01-22 NOTE — Progress Notes (Signed)
Subjective:    Patient ID: Grant [name], male    DOB: 1990/11/21, 26 y.o.   MRN: [id number]  HPI  Patient presents for yearly preventative medicine examination. He is a pleasant 26 year old male who  has a past medical history of Down's syndrome; Hyperlipidemia; and Hypothyroidism.   He has recently moved in with new caregivers as his mother moved to [place].   All immunizations and health maintenance protocols were reviewed with the patient and needed orders were placed.  Appropriate screening laboratory values were ordered for the patient including screening of hyperlipidemia, renal function and hepatic function.  Medication reconciliation,  past medical history, social history, problem list and allergies were reviewed in detail with the patient  Goals were established with regard to weight loss, exercise, and  diet in compliance with medications. Grant [name] has been exercising more and is a heart healthy diet. He is no longer drinking soda and is only drinking water.   He takes Synthroid for hypothyroidism   He takes Lipitor for severely elevated triglycerides   Family would like him to see Cardiology here in [facility] for a congenital abnormality of the heart. He was seen by [facility] in the past    Review of Systems  Unable to perform ROS: Other   Past Medical History:  Diagnosis Date  . Down's syndrome   . Hyperlipidemia   . Hypothyroidism     Social History   Social History  . Marital status: Married    Spouse name: N/A  . Number of children: N/A  . Years of education: N/A   Occupational History  . Not on file.   Social History Main Topics  . Smoking status: Never Smoker  . Smokeless tobacco: Never Used  . Alcohol use 0.0 oz/week  . Drug use: Unknown  . Sexual activity: Not on file   Other Topics Concern  . Not on file   Social History Narrative  . No narrative on file    Past Surgical History:  Procedure Laterality Date  . CARDIAC SURGERY     open heart as a newborn   . LUNG SURGERY      No family history on file.  Allergies  Allergen Reactions  . Codeine   . Doxycycline   . Penicillins     Current Outpatient Prescriptions on File Prior to Visit  Medication Sig Dispense Refill  . acetone, urine, test strip 1 strip by Does not apply route as needed for high blood sugar. 25 each 0  . atorvastatin (LIPITOR) 40 MG tablet Take 1 tablet (40 mg total) by mouth daily at 6 PM. 90 tablet 3  . levothyroxine (SYNTHROID, LEVOTHROID) 50 MCG tablet Take 1 tablet (50 mcg total) by mouth daily before breakfast. 90 tablet 3   No current facility-administered medications on file prior to visit.     BP 112/76 (BP Location: Right Arm)   Temp 97.6 F (36.4 C) (Oral)   Ht 4' 11.5" (1.511 m)   Wt 184 lb (83.5 kg)   BMI 36.54 kg/m       Objective:   Physical Exam  Constitutional: He is oriented to person, place, and time. He appears well-developed and well-nourished. No distress.  Cardiovascular: Normal rate, regular rhythm, normal heart sounds and intact distal pulses.  Exam reveals no gallop and no friction rub.   No murmur heard. Pulmonary/Chest: Effort normal and breath sounds normal. No respiratory distress. He has no wheezes. He has no rales. He exhibits no  tenderness.  Abdominal: Soft. Bowel sounds are normal. He exhibits no distension and no mass. There is no tenderness. There is no rebound and no guarding.  Musculoskeletal: Normal range of motion. He exhibits no edema or tenderness.  Neurological: He is alert and oriented to person, place, and time. He has normal reflexes. He displays normal reflexes. No cranial nerve deficit. He exhibits normal muscle tone. Coordination normal.  Skin: Skin is warm and dry. No rash noted. He is not diaphoretic. No erythema. No pallor.  Psychiatric: He has a normal mood and affect. His behavior is normal. Judgment and thought content normal.  Nursing note and vitals reviewed.     Assessment &  Plan:  1. Routine general medical examination at a health care facility - Basic metabolic panel - CBC with Differential/Platelet - Hemoglobin A1c - Hepatic function panel - Lipid panel - TSH - T4, Free - T3, Free   2. Seborrheic dermatitis  - ketoconazole (NIZORAL) 2 % shampoo; Apply 1 application topically 2 (two) times a week.  Dispense: 120 mL; Refill: 5  3. Mixed hyperlipidemia - I am hopeful with his new diet and exercise regimen he will be able to stop Lipitor  - Basic metabolic panel - CBC with Differential/Platelet - Hemoglobin A1c - Hepatic function panel - Lipid panel - TSH - T4, Free - T3, Free  4. Congenital anomaly of heart  - Ambulatory referral to Cardiology  5. Hypothyroidism, unspecified type  - Consider change in dose of synthroid  - Basic metabolic panel - CBC with Differential/Platelet - Hemoglobin A1c - Hepatic function panel - Lipid panel - TSH - T4, Free - T3, Free  Shirline Frees, NP

## 2017-01-22 NOTE — Patient Instructions (Signed)
It was great seeing you today   I will follow up with you about the blood work and then we can discuss follow up appoitments  Someone from cardiology will call you to schedule an appointment

## 2017-01-23 ENCOUNTER — Other Ambulatory Visit: Payer: Self-pay | Admitting: Adult Health

## 2017-01-23 MED ORDER — LEVOTHYROXINE SODIUM 75 MCG PO TABS: 75.0000 ug | ORAL_TABLET | Freq: Every day | ORAL | 1 refills | Status: DC

## 2017-01-23 MED ORDER — ATORVASTATIN CALCIUM 40 MG PO TABS: 40.0000 mg | ORAL_TABLET | Freq: Every day | ORAL | 3 refills | Status: DC

## 2017-01-24 ENCOUNTER — Other Ambulatory Visit: Payer: Managed Care, Other (non HMO)

## 2017-02-26 ENCOUNTER — Encounter: Payer: Self-pay | Admitting: Cardiology

## 2017-02-26 DIAGNOSIS — Q212 Atrioventricular septal defect, unspecified as to partial or complete: Secondary | ICD-10-CM | POA: Insufficient documentation

## 2017-02-26 HISTORY — DX: Atrioventricular septal defect, unspecified as to partial or complete: Q21.20

## 2017-02-26 NOTE — Progress Notes (Signed)
Cardiology Office Note:    Date:  02/27/2017   ID:  Grant [name], DOB [birth year], MRN [id number]  PCP:  Shirline Frees, NP  Cardiologist:  Norman Herrlich, MD   Referring MD: Shirline Frees, NP  ASSESSMENT:    1. Atrioventricular canal type ventricular septal defect   2. DOWNS SYNDROME   3. Hyperlipidemia, unspecified hyperlipidemia type    PLAN:    In order of problems listed above:  1. Clinically stable after corrective surgery at 4-1/2 months [employer], at risk for valvular regurgitation and atrial arrhythmia and echocardiograms ordered. His blood pressure is optimal and he has no evidence of congestive heart failure and I encouraged his helpful lifestyle changes with activity diet and weight loss. At this time I placed him under no restrictions. He will continue endocarditis prophylaxis with amoxicillin. Continue statin for dyslipidemia  Next appointment in 1 year   Medication Adjustments/Labs and Tests Ordered: Current medicines are reviewed at length with the patient today.  Concerns regarding medicines are outlined above.  Orders Placed This Encounter  Procedures  . EKG 12-Lead  . ECHOCARDIOGRAM COMPLETE   Meds ordered this encounter  Medications  . amoxicillin (AMOXIL) 500 MG capsule    Sig: Take 4 capsules (2,000 mg total) by mouth daily as needed.    Dispense:  30 capsule    Refill:  2     Chief Complaint  Patient presents with  . New Patient (Initial Visit)    per Angela Adam, NP congenital abnormality of the heart  . Shortness of Breath    History of Present Illness:    Grant [name] is a 26 y.o. male with Down's syndrome and AV canal septal defect with  repair who is being seen today for the evaluation of congenital heart defect at the request of Shirline Frees, NP. He has transitioned to an adult home with supervision. This has been good for him he is getting 150 minutes a week of activity and has had weight loss of approximately 10  pounds with dietary restriction. He's had no chest pain shortness of breath palpitation syncope TIA fever or chills. He is not allergic to amoxicillin and I renewed his prescription for endocarditis prophylaxis.   Past Medical History:  Diagnosis Date  . Carbuncle and furuncle of unspecified site 08/07/2009   Qualifier: Diagnosis of  By: Tawanna Cooler MD, Eugenio Hoes   . 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 Down's syndrome   . FATIGUE 04/02/2010   Qualifier: Diagnosis of  By: Tawanna Cooler MD, Eugenio Hoes   . Hyperlipidemia   . Hypothyroidism   . Routine general medical examination at a health care facility 01/19/2016    Past Surgical History:  Procedure Laterality Date  . CARDIAC SURGERY     AV canal VSD repair  . HERNIA REPAIR    . LUNG SURGERY     drcortication of right lung empyema    Current Medications: Current Meds  Medication Sig  . acetone, urine, test strip 1 strip by Does not apply route as needed for high blood sugar.  Marland Kitchen atorvastatin (LIPITOR) 40 MG tablet Take 1 tablet (40 mg total) by mouth daily at 6 PM.  . ketoconazole (NIZORAL) 2 % shampoo Apply 1 application topically 2 (two) times a week.  . levothyroxine (SYNTHROID, LEVOTHROID) 75 MCG tablet Take 1 tablet (75 mcg total) by mouth daily before breakfast.     Allergies:   Codeine;  Doxycycline; and Penicillins   Social History   Social History  . Marital status: Married    Spouse name: N/A  . Number of children: N/A  . Years of education: N/A   Social History Main Topics  . Smoking status: Never Smoker  . Smokeless tobacco: Never Used  . Alcohol use No  . Drug use: No  . Sexual activity: Not Asked   Other Topics Concern  . None   Social History Narrative  . None     Family History: The patient'sister has autism ROS:   ROS Please see the history of present illness.     All other systems reviewed and are negative.  EKGs/Labs/Other Studies Reviewed:    The  following studies were reviewed today:   EKG:  EKG is  ordered today.  The ekg ordered today demonstrates sinus rhythm normal, no evidence of atrial or ventricular enlargement or hypertrophy. Sinus rhythm  Recent Labs: 01/22/2017: ALT 26; BUN 12; Creatinine, Ser 1.15; Hemoglobin 15.8; Platelets 242.0; Potassium 4.1; Sodium 141; TSH 11.56  Recent Lipid Panel    Component Value Date/Time   CHOL 135 01/22/2017 1219   TRIG 337.0 (H) 01/22/2017 1219   HDL 27.10 (L) 01/22/2017 1219   CHOLHDL 5 01/22/2017 1219   VLDL 67.4 (H) 01/22/2017 1219   LDLDIRECT 56.0 01/22/2017 1219    Physical Exam:    VS:  BP 94/64 (BP Location: Left Arm, Patient Position: Sitting)   Pulse 76   Ht  (1.499 m)   Wt 178 lb 1.9 oz (80.8 kg)   SpO2 97%   BMI 35.98 kg/m     Wt Readings from Last 3 Encounters:  02/27/17 178 lb 1.9 oz (80.8 kg)  01/22/17 184 lb (83.5 kg)  06/26/16 189 lb 12.8 oz (86.1 kg)     GEN:  Well nourished, well developed in no acute distress he has the characteristic appearance of Down syndrome HEENT: Normal NECK: No JVD; No carotid bruits LYMPHATICS: No lymphadenopathy CARDIAC: RRR, no murmurs, rubs, gallops RESPIRATORY:  Clear to auscultation without rales, wheezing or rhonchi  ABDOMEN: Soft, non-tender, non-distended MUSCULOSKELETAL:  No edema; No deformity  SKIN: Warm and dry NEUROLOGIC:  Alert and oriented x 3 PSYCHIATRIC:  Normal affect     Signed, Norman Herrlich, MD  02/27/2017 2:22 PM    [facility]

## 2017-02-27 ENCOUNTER — Ambulatory Visit (INDEPENDENT_AMBULATORY_CARE_PROVIDER_SITE_OTHER): Payer: Managed Care, Other (non HMO) | Admitting: Cardiology

## 2017-02-27 ENCOUNTER — Encounter: Payer: Self-pay | Admitting: Cardiology

## 2017-02-27 VITALS — BP 94/64 | HR 76 | Ht 59.0 in | Wt 178.1 lb

## 2017-02-27 DIAGNOSIS — Q909 Down syndrome, unspecified: Secondary | ICD-10-CM | POA: Diagnosis not present

## 2017-02-27 DIAGNOSIS — E785 Hyperlipidemia, unspecified: Secondary | ICD-10-CM

## 2017-02-27 DIAGNOSIS — Q212 Atrioventricular septal defect, unspecified as to partial or complete: Secondary | ICD-10-CM

## 2017-02-27 MED ORDER — AMOXICILLIN 500 MG PO CAPS: 2000.0000 mg | ORAL_CAPSULE | Freq: Every day | ORAL | 2 refills | Status: DC | PRN

## 2017-02-27 NOTE — Patient Instructions (Signed)
Medication Instructions:  None  Labwork: None  Testing/Procedures: Your physician has requested that you have an echocardiogram. Echocardiography is a painless test that uses sound waves to create images of your heart. It provides your doctor with information about the size and shape of your heart and how well your heart's chambers and valves are working. This procedure takes approximately one hour. There are no restrictions for this procedure.  Follow-Up: Your physician wants you to follow-up in: 1 year. You will receive a reminder letter in the mail two months in advance. If you don't receive a letter, please call our office to schedule the follow-up appointment.   Any Other Special Instructions Will Be Listed Below (If Applicable).     If you need a refill on your cardiac medications before your next appointment, please call your pharmacy.

## 2017-03-19 ENCOUNTER — Ambulatory Visit (HOSPITAL_BASED_OUTPATIENT_CLINIC_OR_DEPARTMENT_OTHER)
Admission: RE | Admit: 2017-03-19 | Discharge: 2017-03-19 | Disposition: A | Discharge status: Home / Self Care | Payer: Managed Care, Other (non HMO) | Source: Ambulatory Visit | Attending: Cardiology | Admitting: Cardiology

## 2017-03-19 DIAGNOSIS — Q212 Atrioventricular septal defect, unspecified as to partial or complete: Secondary | ICD-10-CM

## 2017-03-19 DIAGNOSIS — Q909 Down syndrome, unspecified: Secondary | ICD-10-CM | POA: Diagnosis not present

## 2017-03-19 DIAGNOSIS — E785 Hyperlipidemia, unspecified: Secondary | ICD-10-CM | POA: Insufficient documentation

## 2017-03-19 NOTE — Progress Notes (Signed)
  Echocardiogram Echocardiogram Transesophageal has been performed.  Ranard Harte T Aadith Raudenbush 03/19/2017, 9:26 AM

## 2017-07-31 ENCOUNTER — Other Ambulatory Visit: Payer: Self-pay | Admitting: Adult Health

## 2017-07-31 NOTE — Telephone Encounter (Signed)
Due for lipid follow up.

## 2017-08-01 NOTE — Telephone Encounter (Signed)
Sent to the pharmacy by e-scribe.  Notified Lorella NimrodHarvey (caregiver) that Onalee HuaDavid is due for lipid follow up.  Lorella NimrodHarvey to call back and schedule.

## 2017-08-04 ENCOUNTER — Other Ambulatory Visit (INDEPENDENT_AMBULATORY_CARE_PROVIDER_SITE_OTHER): Payer: Managed Care, Other (non HMO)

## 2017-08-04 DIAGNOSIS — E785 Hyperlipidemia, unspecified: Secondary | ICD-10-CM

## 2017-08-04 LAB — LIPID PANEL
Cholesterol: 104 mg/dL (ref 0–200)
HDL: 25.2 mg/dL — ABNORMAL LOW (ref >39.00)
NONHDL: 78.31
TRIGLYCERIDES: 242 mg/dL — AB (ref 0.0–149.0)
Total CHOL/HDL Ratio: 4
VLDL: 48.4 mg/dL — ABNORMAL HIGH (ref 0.0–40.0)

## 2017-08-04 LAB — LDL CHOLESTEROL, DIRECT: LDL DIRECT: 45 mg/dL

## 2017-08-05 ENCOUNTER — Encounter: Payer: Self-pay | Admitting: Adult Health

## 2018-02-02 ENCOUNTER — Other Ambulatory Visit: Payer: Self-pay | Admitting: Adult Health

## 2018-03-13 ENCOUNTER — Ambulatory Visit (INDEPENDENT_AMBULATORY_CARE_PROVIDER_SITE_OTHER): Payer: Managed Care, Other (non HMO) | Admitting: Adult Health

## 2018-03-13 ENCOUNTER — Encounter: Payer: Self-pay | Admitting: Adult Health

## 2018-03-13 ENCOUNTER — Ambulatory Visit: Payer: Managed Care, Other (non HMO) | Admitting: Adult Health

## 2018-03-13 VITALS — BP 102/68 | HR 96 | Temp 97.7°F | Wt 135.0 lb

## 2018-03-13 DIAGNOSIS — R197 Diarrhea, unspecified: Secondary | ICD-10-CM

## 2018-03-13 LAB — BASIC METABOLIC PANEL
BUN: 13 mg/dL (ref 7–25)
CALCIUM: 9.2 mg/dL (ref 8.6–10.3)
CO2: 28 mmol/L (ref 20–32)
CREATININE: 1.02 mg/dL (ref 0.60–1.35)
Chloride: 103 mmol/L (ref 98–110)
GLUCOSE: 92 mg/dL (ref 65–99)
POTASSIUM: 3.9 mmol/L (ref 3.5–5.3)
Sodium: 141 mmol/L (ref 135–146)

## 2018-03-13 LAB — CBC WITH DIFFERENTIAL/PLATELET
BASOS PCT: 0.5 %
Basophils Absolute: 53 cells/uL (ref 0–200)
EOS PCT: 2 %
Eosinophils Absolute: 212 cells/uL (ref 15–500)
HEMATOCRIT: 42.9 % (ref 38.5–50.0)
Hemoglobin: 14.7 g/dL (ref 13.2–17.1)
LYMPHS ABS: 2162 {cells}/uL (ref 850–3900)
MCH: 31.7 pg (ref 27.0–33.0)
MCHC: 34.3 g/dL (ref 32.0–36.0)
MCV: 92.5 fL (ref 80.0–100.0)
MPV: 9.1 fL (ref 7.5–12.5)
Monocytes Relative: 7 %
Neutro Abs: 7431 cells/uL (ref 1500–7800)
Neutrophils Relative %: 70.1 %
Platelets: 337 10*3/uL (ref 140–400)
RBC: 4.64 10*6/uL (ref 4.20–5.80)
RDW: 13.5 % (ref 11.0–15.0)
Total Lymphocyte: 20.4 %
WBC: 10.6 10*3/uL (ref 3.8–10.8)
WBCMIX: 742 {cells}/uL (ref 200–950)

## 2018-03-13 NOTE — Progress Notes (Signed)
Subjective:    Patient ID: Grant [name], male    DOB: 1991-01-21, 27 y.o.   MRN: [id number]  HPI 27 year old male who  has a past medical history of Carbuncle and furuncle of unspecified site (08/07/2009), Congenital anomaly of heart (04/01/2008), Down's syndrome, FATIGUE (04/02/2010), Hyperlipidemia, Hypothyroidism, and Routine general medical examination at a health care facility (01/19/2016).  Presents with legal guardian for 5 days of diarrhea. Diarrhea started soon after eating food from a [employer]. Denies complaints of nausea or vomiting. Has not had a fever but has felt warm. Diarrhea after eating but has been becoming less frequent. Caregiver has been giving pepto and forcing fluids. No one else in the household is sick   Review of Systems  Unable to perform ROS: Other   Past Medical History:  Diagnosis Date  . Carbuncle and furuncle of unspecified site 08/07/2009   Qualifier: Diagnosis of  By: Tawanna Cooler MD, Eugenio Hoes   . 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 Down's syndrome   . FATIGUE 04/02/2010   Qualifier: Diagnosis of  By: Tawanna Cooler MD, Eugenio Hoes   . Hyperlipidemia   . Hypothyroidism   . Routine general medical examination at a health care facility 01/19/2016    Social History   Socioeconomic History  . Marital status: Married    Spouse name: Not on file  . Number of children: Not on file  . Years of education: Not on file  . Highest education level: Not on file  Occupational History  . Not on file  Social Needs  . Financial resource strain: Not on file  . Food insecurity:    Worry: Not on file    Inability: Not on file  . Transportation needs:    Medical: Not on file    Non-medical: Not on file  Tobacco Use  . Smoking status: Never Smoker  . Smokeless tobacco: Never Used  Substance and Sexual Activity  . Alcohol use: No    Alcohol/week: 0.0 standard drinks  . Drug use: No  . Sexual activity: Not on  file  Lifestyle  . Physical activity:    Days per week: Not on file    Minutes per session: Not on file  . Stress: Not on file  Relationships  . Social connections:    Talks on phone: Not on file    Gets together: Not on file    Attends religious service: Not on file    Active member of club or organization: Not on file    Attends meetings of clubs or organizations: Not on file    Relationship status: Not on file  . Intimate partner violence:    Fear of current or ex partner: Not on file    Emotionally abused: Not on file    Physically abused: Not on file    Forced sexual activity: Not on file  Other Topics Concern  . Not on file  Social History Narrative  . Not on file    Past Surgical History:  Procedure Laterality Date  . CARDIAC SURGERY     AV canal VSD repair  . HERNIA REPAIR    . LUNG SURGERY     drcortication of right lung empyema    Family History  Problem Relation Age of Onset  . Hyperlipidemia Mother   . Hyperlipidemia Paternal Grandmother   . Heart attack Paternal Grandfather     Allergies  Allergen  Reactions  . Codeine   . Doxycycline   . Penicillins     Current Outpatient Medications on File Prior to Visit  Medication Sig Dispense Refill  . atorvastatin (LIPITOR) 40 MG tablet TAKE ONE TABLET BY MOUTH DAILY AT 6 PM 90 tablet 0  . ketoconazole (NIZORAL) 2 % shampoo Apply 1 application topically 2 (two) times a week. 120 mL 5  . levothyroxine (SYNTHROID, LEVOTHROID) 75 MCG tablet Take 1 tablet (75 mcg total) by mouth daily before breakfast. 90 tablet 1   No current facility-administered medications on file prior to visit.     BP 102/68 (BP Location: Right Arm, Patient Position: Sitting, Cuff Size: Normal)   Pulse 96   Temp 97.7 F (36.5 C) (Oral)   Wt 135 lb (61.2 kg)   SpO2 96%   BMI 27.27 kg/m       Objective:   Physical Exam  Constitutional: He appears well-nourished. No distress.  Cardiovascular: Normal rate, regular rhythm, normal  heart sounds and intact distal pulses.  Pulmonary/Chest: Effort normal and breath sounds normal.  Abdominal: Soft. Normal appearance and bowel sounds are normal. He exhibits no distension and no mass. There is no tenderness. There is no rebound and no guarding. No hernia.  Skin: Skin is warm and dry. He is not diaphoretic.  Psychiatric: He has a normal mood and affect. His behavior is normal. Judgment and thought content normal.  Nursing note and vitals reviewed.     Assessment & Plan:  1. Diarrhea, unspecified type - Likely viral gastroenteritis. Will check basic labs. Advised to continue with Pepto and drink plenty of fluids. Will follow up with caregiver once labs have returned.   - Basic Metabolic Panel (BMET) - CBC with Differential/Platelet   Shirline Frees, NP

## 2018-03-15 ENCOUNTER — Other Ambulatory Visit: Payer: Self-pay | Admitting: Adult Health

## 2018-03-15 DIAGNOSIS — E785 Hyperlipidemia, unspecified: Secondary | ICD-10-CM

## 2018-03-15 NOTE — Progress Notes (Signed)
Encounter for lab.  

## 2018-06-23 ENCOUNTER — Telehealth: Payer: Self-pay | Admitting: Adult Health

## 2018-06-23 NOTE — Telephone Encounter (Signed)
Copied from CRM [id number]. Topic: Referral - Request for Referral >> Jun 23, 2018  9:22 AM Maia Petties wrote: Has patient seen PCP for this complaint? yes *If NO, is insurance requiring patient see PCP for this issue before PCP can refer them? unknown Referral for which specialty: Nutritionist Preferred provider/office: provider preference/local Reason for referral: Pt has had a lot of changes over the past year. Pt is back in weight range, eating healthy, and exercising. Due to other medical conditions with heart, etc they want to have nutritionist consult.

## 2018-06-25 ENCOUNTER — Other Ambulatory Visit: Payer: Self-pay | Admitting: *Deleted

## 2018-06-25 DIAGNOSIS — R635 Abnormal weight gain: Secondary | ICD-10-CM

## 2018-06-25 NOTE — Telephone Encounter (Signed)
Referral placed as requested.

## 2018-06-25 NOTE — Telephone Encounter (Signed)
That is fine 

## 2018-06-25 NOTE — Telephone Encounter (Signed)
Is it okay to place referral as requested. 

## 2018-07-09 ENCOUNTER — Encounter: Payer: Self-pay | Admitting: Registered"

## 2018-07-09 ENCOUNTER — Encounter: Payer: Managed Care, Other (non HMO) | Attending: Adult Health | Admitting: Registered"

## 2018-07-09 DIAGNOSIS — R635 Abnormal weight gain: Secondary | ICD-10-CM | POA: Insufficient documentation

## 2018-07-09 NOTE — Patient Instructions (Addendum)
-   Aim to have carbohydrates + protein as snack options such as fruit + peanut butter or cheese + crackers or peanut butter + crackers or greek yogurt.  - Use handout as guide to help reduce triglycerides.   - Keep up the great work having balanced meals throughout the day.

## 2018-07-09 NOTE — Progress Notes (Signed)
  Medical Nutrition Therapy:  Appt start time: 11:15 end time:  11:45.   Assessment:  Primary concerns today: Pt arrives with caretaker who he lives with. Caregiver states patient has abnormal lipid values: Trig (242), HDL (25).   Pt expectations:  Caregiver states pt used to weight about 180-189 lbs in 2018 before he started living with him in 2018. Caregiver states he wants to make sure pt is doing well and they are on track with what is best for pt. Caregier states pt visits mom and will return sometimes sick-vomiting and diarrhea. States pt visits mom about once a month; during that time will consume various sugar-sweetened beverages and lots of food. Caregiver states day-time caregiver meal preps and cooks for pt. Caregiver states pt is able to let them know when he is hungry and has freedom to access snacks; will walk in the room and say "hungry". States pt likes a variety of food options.   Pts triglyceride has decreased significantly over the years, from 1101-242.   Preferred Learning Style:   No preference indicated   Learning Readiness:   Ready  Change in progress   MEDICATIONS: See list   DIETARY INTAKE:  Usual eating pattern includes 3 meals and 0-1 snacks per day.  Everyday foods include rice, chicken, vegetables, salmon, asparagus, potatoes, eggs, pancakes, yogurt and fruit.  Avoided foods include none stated.    24-hr recall:  B ( AM): eggs + pancakes or yogurt + fruit  Snk ( AM): none  L ( PM): Subway or salad + protein or Viva Chicken-rice + chicken + vegetables Snk ( PM): oranges or fruit snacks D ( PM): Hello Fresh-burger/chicken + potatoes + asparagus Snk ( PM): none Beverages: water, pink lemonade, Sprite  Usual physical activity: treadmill + strength training + stretching 60 min, 3-4x/week  Estimated energy needs: 2200 calories 248 g carbohydrates 165 g protein 61 g fat  Progress Towards Goal(s):  In progress.   Nutritional Diagnosis:  NB-1.1  Food and nutrition-related knowledge deficit As related to high triglycerides.  As evidenced by lab results.    Intervention:  Nutrition education and counseling. Caregiver was educated and counseled on balanced meals/snacks, snack ideas, and ways to reduce triglycerides. Caregiver was in agreement with goals provided for pt.  Goals: - Aim to have carbohydrates + protein as snack options such as fruit + peanut butter or cheese + crackers or peanut butter + crackers or greek yogurt. - Use handout as guide to help reduce triglycerides.  - Keep up the great work having balanced meals throughout the day.   Teaching Method Utilized:  Visual Auditory Hands on  Handouts given during visit include:  High Triglycerides Nutrition Therapy   Barriers to learning/adherence to lifestyle change: needs a caregiver to provide everything for him  Demonstrated degree of understanding via:  Teach Back   Monitoring/Evaluation:  Dietary intake, exercise, and body weight prn.

## 2018-07-10 ENCOUNTER — Encounter: Payer: Self-pay | Admitting: Adult Health

## 2018-07-13 ENCOUNTER — Other Ambulatory Visit (INDEPENDENT_AMBULATORY_CARE_PROVIDER_SITE_OTHER): Payer: Managed Care, Other (non HMO)

## 2018-07-13 ENCOUNTER — Other Ambulatory Visit: Payer: Managed Care, Other (non HMO)

## 2018-07-13 DIAGNOSIS — E785 Hyperlipidemia, unspecified: Secondary | ICD-10-CM | POA: Diagnosis not present

## 2018-07-13 LAB — LIPID PANEL
CHOL/HDL RATIO: 6
CHOLESTEROL: 184 mg/dL (ref 0–200)
HDL: 30.4 mg/dL — AB (ref >39.00)
NONHDL: 153.61
Triglycerides: 287 mg/dL — ABNORMAL HIGH (ref 0.0–149.0)
VLDL: 57.4 mg/dL — ABNORMAL HIGH (ref 0.0–40.0)

## 2018-07-13 LAB — LDL CHOLESTEROL, DIRECT: Direct LDL: 108 mg/dL

## 2018-07-14 ENCOUNTER — Encounter: Payer: Self-pay | Admitting: Adult Health

## 2018-09-21 ENCOUNTER — Encounter: Payer: Self-pay | Admitting: Adult Health

## 2018-09-22 ENCOUNTER — Other Ambulatory Visit: Payer: Self-pay

## 2018-09-22 ENCOUNTER — Encounter: Payer: Self-pay | Admitting: Adult Health

## 2018-09-22 ENCOUNTER — Ambulatory Visit (INDEPENDENT_AMBULATORY_CARE_PROVIDER_SITE_OTHER): Payer: Managed Care, Other (non HMO) | Admitting: Adult Health

## 2018-09-22 DIAGNOSIS — B349 Viral infection, unspecified: Secondary | ICD-10-CM | POA: Diagnosis not present

## 2018-09-22 NOTE — Progress Notes (Signed)
Virtual Visit via Video Note  I connected with Grant [name] on 09/22/18 at 11:00 AM EDT by a video enabled telemedicine application and verified that I am speaking with the correct person using two identifiers.  Location patient: home Location provider:work or home office Persons participating in the virtual visit: patient, provider and care giver  I discussed the limitations of evaluation and management by telemedicine and the availability of in person appointments. The patient expressed understanding and agreed to proceed.   HPI: 28 year old male who is being evaluated today for fever.  Guardian reports that starting 3 days ago he has had a low-grade fever.  Reports Saturday with fever of 100.3, Sunday with a low-grade fever of 99.4, Monday with a low grade fever of 99.6, and today his temperature is 99 degrees.  Never reports no loss of appetite, shortness of breath, nausea, vomiting, diarrhea.  He has been quarantined to home there is no acute concerns about COVID infection.  Giver reports that Grant [name] has not complained of body aches, sinus pain or pressure, or any other acute issues  Caregiver has been giving him NyQuil at night and keeping him hydrated with Pedialyte and Gatorade Zero during the day.   ROS: See pertinent positives and negatives per HPI.  Past Medical History:  Diagnosis Date  . Carbuncle and furuncle of unspecified site 08/07/2009   Qualifier: Diagnosis of  By: Tawanna Cooler MD, Eugenio Hoes   . 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 Down's syndrome   . FATIGUE 04/02/2010   Qualifier: Diagnosis of  By: Tawanna Cooler MD, Eugenio Hoes   . Hyperlipidemia   . Hypothyroidism   . Routine general medical examination at a health care facility 01/19/2016    Past Surgical History:  Procedure Laterality Date  . CARDIAC SURGERY     AV canal VSD repair  . HERNIA REPAIR    . LUNG SURGERY     drcortication of right lung empyema     Family History  Problem Relation Age of Onset  . Hyperlipidemia Mother   . Hyperlipidemia Paternal Grandmother   . Heart attack Paternal Grandfather      Current Outpatient Medications:  .  atorvastatin (LIPITOR) 40 MG tablet, TAKE ONE TABLET BY MOUTH DAILY AT 6 PM, Disp: 90 tablet, Rfl: 0 .  ketoconazole (NIZORAL) 2 % shampoo, Apply 1 application topically 2 (two) times a week., Disp: 120 mL, Rfl: 5 .  levothyroxine (SYNTHROID, LEVOTHROID) 75 MCG tablet, Take 1 tablet (75 mcg total) by mouth daily before breakfast., Disp: 90 tablet, Rfl: 1  EXAM:  VITALS per patient if applicable:  GENERAL: alert, oriented, appears well and in no acute distress  HEENT: atraumatic, conjunttiva clear, no obvious abnormalities on inspection of external nose and ears  NECK: normal movements of the head and neck  LUNGS: on inspection no signs of respiratory distress, breathing rate appears normal, no obvious gross SOB, gasping or wheezing  CV: no obvious cyanosis  MS: moves all visible extremities without noticeable abnormality  PSYCH/NEURO: pleasant and cooperative, no obvious depression or anxiety, speech and thought processing grossly intact  ASSESSMENT AND PLAN:  Discussed the following assessment and plan:  Viral illness -Exam consistent with unspecified viral illness.  Not concern for COVID at this time.  Advised Tylenol 650 mg every 8 hours as needed.  Do not give any additional Tylenol if giving NyQuil.  10 you to stay well-hydrated.  Follow-up in 2  to 3 days if fever has not resolved    I discussed the assessment and treatment plan with the patient. The patient was provided an opportunity to ask questions and all were answered. The patient agreed with the plan and demonstrated an understanding of the instructions.   The patient was advised to call back or seek an in-person evaluation if the symptoms worsen or if the condition fails to improve as anticipated.   Shirline Freesory Kynsli Haapala, NP

## 2018-09-24 ENCOUNTER — Encounter: Payer: Self-pay | Admitting: Adult Health

## 2018-09-25 ENCOUNTER — Ambulatory Visit (INDEPENDENT_AMBULATORY_CARE_PROVIDER_SITE_OTHER): Payer: Managed Care, Other (non HMO) | Admitting: Adult Health

## 2018-09-25 ENCOUNTER — Ambulatory Visit (INDEPENDENT_AMBULATORY_CARE_PROVIDER_SITE_OTHER): Payer: Managed Care, Other (non HMO)

## 2018-09-25 ENCOUNTER — Other Ambulatory Visit: Payer: Self-pay | Admitting: Adult Health

## 2018-09-25 ENCOUNTER — Encounter: Payer: Self-pay | Admitting: Adult Health

## 2018-09-25 ENCOUNTER — Other Ambulatory Visit: Payer: Self-pay

## 2018-09-25 VITALS — BP 94/60 | Temp 101.2°F | Resp 16 | Wt 131.0 lb

## 2018-09-25 DIAGNOSIS — R05 Cough: Secondary | ICD-10-CM

## 2018-09-25 DIAGNOSIS — J069 Acute upper respiratory infection, unspecified: Secondary | ICD-10-CM

## 2018-09-25 DIAGNOSIS — R059 Cough, unspecified: Secondary | ICD-10-CM

## 2018-09-25 LAB — CBC WITH DIFFERENTIAL/PLATELET
Basophils Absolute: 0.1 10*3/uL (ref 0.0–0.1)
Basophils Relative: 0.5 % (ref 0.0–3.0)
Eosinophils Absolute: 0.1 10*3/uL (ref 0.0–0.7)
Eosinophils Relative: 0.7 % (ref 0.0–5.0)
HCT: 34.2 % — ABNORMAL LOW (ref 39.0–52.0)
Hemoglobin: 11.9 g/dL — ABNORMAL LOW (ref 13.0–17.0)
Lymphocytes Relative: 12.1 % (ref 12.0–46.0)
Lymphs Abs: 1.2 10*3/uL (ref 0.7–4.0)
MCHC: 34.9 g/dL (ref 30.0–36.0)
MCV: 95 fl (ref 78.0–100.0)
Monocytes Absolute: 1.1 10*3/uL — ABNORMAL HIGH (ref 0.1–1.0)
Monocytes Relative: 11.1 % (ref 3.0–12.0)
Neutro Abs: 7.6 10*3/uL (ref 1.4–7.7)
Neutrophils Relative %: 75.6 % (ref 43.0–77.0)
Platelets: 338 10*3/uL (ref 150.0–400.0)
RBC: 3.6 Mil/uL — ABNORMAL LOW (ref 4.22–5.81)
RDW: 13.9 % (ref 11.5–15.5)
WBC: 10 10*3/uL (ref 4.0–10.5)

## 2018-09-25 LAB — COMPREHENSIVE METABOLIC PANEL
ALT: 22 U/L (ref 0–53)
AST: 16 U/L (ref 0–37)
Albumin: 3.2 g/dL — ABNORMAL LOW (ref 3.5–5.2)
Alkaline Phosphatase: 54 U/L (ref 39–117)
BUN: 11 mg/dL (ref 6–23)
CO2: 30 mEq/L (ref 19–32)
Calcium: 8.1 mg/dL — ABNORMAL LOW (ref 8.4–10.5)
Chloride: 101 mEq/L (ref 96–112)
Creatinine, Ser: 0.78 mg/dL (ref 0.40–1.50)
GFR: 119.08 mL/min (ref >60.00)
Glucose, Bld: 89 mg/dL (ref 70–99)
Potassium: 4 mEq/L (ref 3.5–5.1)
Sodium: 140 mEq/L (ref 135–145)
Total Bilirubin: 0.5 mg/dL (ref 0.2–1.2)
Total Protein: 6.4 g/dL (ref 6.0–8.3)

## 2018-09-25 MED ORDER — LEVOFLOXACIN 500 MG PO TABS: 500.0000 mg | ORAL_TABLET | Freq: Every day | ORAL | 0 refills | Status: DC

## 2018-09-25 NOTE — Telephone Encounter (Signed)
Pt now scheduled to see Cory.  Nothing further needed at this time. 

## 2018-09-25 NOTE — Progress Notes (Signed)
Subjective:    Patient ID: Grant [name], male    DOB: August 03, 1990, 28 y.o.   MRN: [id number]  HPI 28 year old male who  has a past medical history of Carbuncle and furuncle of unspecified site (08/07/2009), Congenital anomaly of heart (04/01/2008), Down's syndrome, FATIGUE (04/02/2010), Hyperlipidemia, Hypothyroidism, and Routine general medical examination at a health care facility (01/19/2016).  He presents with his caregiver  Originally seen 3 days ago via virtual visit for fever x3 days.  At this time, his fever at its highest was 100.3.  Had any shortness of breath, nausea, vomiting, body aches, diarrhea.  Today he is brought into the office because his caregiver reports that since he was seen 3 days ago he continues to have a fever, he has been having a semi-productive cough since 2 days ago and is also been shivering.  Caregiver has not noticed any shortness of breath but states that he cannot really take a deep breath without coughing.  Continues to report that his appetite is good.  Is getting DayQuil and NyQuil at home which helped suppress his fever.  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   Qualifier: Diagnosis of  By: Tawanna Coolerodd MD, Eugenio HoesJeffrey A    Down's syndrome    FATIGUE 04/02/2010   Qualifier: Diagnosis of  By: Tawanna Coolerodd MD, Eugenio HoesJeffrey A    Hyperlipidemia    Hypothyroidism    Routine general medical examination at a health care facility 01/19/2016    Social History   Socioeconomic History   Marital status: Single    Spouse name: Not on file   Number of children: Not on file   Years of education: Not on file   Highest education level: Not on file  Occupational History   Not on  file  Social Needs   Financial resource strain: Not on file   Food insecurity:    Worry: Not on file    Inability: Not on file   Transportation needs:    Medical: Not on file    Non-medical: Not on file  Tobacco Use   Smoking status: Never Smoker   Smokeless tobacco: Never Used  Substance and Sexual Activity   Alcohol use: No    Alcohol/week: 0.0 standard drinks   Drug use: No   Sexual activity: Not on file  Lifestyle   Physical activity:    Days per week: Not on file    Minutes per session: Not on file   Stress: Not on file  Relationships   Social connections:    Talks on phone: Not on file    Gets together: Not on file    Attends religious service: Not on file    Active member of club or organization: Not on file    Attends meetings of clubs or organizations: Not on file    Relationship status: Not on file   Intimate partner violence:    Fear of current or ex partner: Not on file    Emotionally abused: Not on file    Physically abused: Not on file    Forced sexual activity: Not on file  Other Topics Concern   Not on file  Social  History Narrative   Not on file    Past Surgical History:  Procedure Laterality Date   CARDIAC SURGERY     AV canal VSD repair   HERNIA REPAIR     LUNG SURGERY     drcortication of right lung empyema    Family History  Problem Relation Age of Onset   Hyperlipidemia Mother    Hyperlipidemia Paternal Grandmother    Heart attack Paternal Grandfather     Allergies  Allergen Reactions   Codeine    Doxycycline    Penicillins     Current Outpatient Medications on File Prior to Visit  Medication Sig Dispense Refill   atorvastatin (LIPITOR) 40 MG tablet TAKE ONE TABLET BY MOUTH DAILY AT 6 PM 90 tablet 0   ketoconazole (NIZORAL) 2 % shampoo Apply 1 application topically 2 (two) times a week. 120 mL 5   levothyroxine (SYNTHROID, LEVOTHROID) 75 MCG tablet Take 1 tablet (75 mcg total) by mouth daily before  breakfast. 90 tablet 1   No current facility-administered medications on file prior to visit.     BP 94/60    Temp (!) 101.2 F (38.4 C)    Wt 131 lb (59.4 kg)    BMI 26.46 kg/m       Objective:   Physical Exam Vitals signs and nursing note reviewed.  Constitutional:      Appearance: Normal appearance.  HENT:     Head: Normocephalic and atraumatic.     Nose: Congestion and rhinorrhea present. Rhinorrhea is purulent.     Right Turbinates: Swollen. Not enlarged.     Left Turbinates: Swollen. Not enlarged.     Right Sinus: Frontal sinus tenderness present. No maxillary sinus tenderness.     Left Sinus: Frontal sinus tenderness present. No maxillary sinus tenderness.     Mouth/Throat:     Mouth: Mucous membranes are dry.     Pharynx: Oropharynx is clear.  Eyes:     Extraocular Movements: Extraocular movements intact.     Pupils: Pupils are equal, round, and reactive to light.  Cardiovascular:     Rate and Rhythm: Normal rate and regular rhythm.     Pulses: Normal pulses.     Heart sounds: Normal heart sounds.  Pulmonary:     Effort: Pulmonary effort is normal.     Breath sounds: Normal breath sounds. No stridor. No wheezing or rhonchi.  Abdominal:     General: Bowel sounds are normal.     Palpations: Abdomen is soft.  Musculoskeletal: Normal range of motion.  Skin:    General: Skin is warm and dry.     Capillary Refill: Capillary refill takes less than 2 seconds.  Neurological:     Mental Status: He is alert. Mental status is at baseline.       Assessment & Plan:  1. Upper respiratory tract infection, unspecified type -Possible upper respiratory infection/sinusitis.  Will get chest x-ray to rule out pneumonia.  Very low suspicion for COVID since he has been quarantined and no other family members are sick.  Will wait and see what labs and x-ray show before treating  2. Cough  - DG Chest 2 View; Future - CBC with Differential/Platelet - CMP  Shirline Frees, NP

## 2018-10-01 ENCOUNTER — Other Ambulatory Visit: Payer: Self-pay | Admitting: Adult Health

## 2018-10-01 ENCOUNTER — Encounter: Payer: Self-pay | Admitting: Adult Health

## 2018-10-01 DIAGNOSIS — E785 Hyperlipidemia, unspecified: Secondary | ICD-10-CM

## 2018-10-01 DIAGNOSIS — J069 Acute upper respiratory infection, unspecified: Secondary | ICD-10-CM

## 2018-10-01 DIAGNOSIS — E039 Hypothyroidism, unspecified: Secondary | ICD-10-CM

## 2018-10-01 MED ORDER — ATORVASTATIN CALCIUM 40 MG PO TABS: ORAL_TABLET | ORAL | 1 refills | Status: DC

## 2018-10-01 MED ORDER — LEVOTHYROXINE SODIUM 75 MCG PO TABS: 75.0000 ug | ORAL_TABLET | Freq: Every day | ORAL | 1 refills | Status: DC

## 2018-10-08 ENCOUNTER — Encounter: Payer: Self-pay | Admitting: Adult Health

## 2018-10-08 ENCOUNTER — Ambulatory Visit (INDEPENDENT_AMBULATORY_CARE_PROVIDER_SITE_OTHER): Payer: Managed Care, Other (non HMO)

## 2018-10-08 DIAGNOSIS — J069 Acute upper respiratory infection, unspecified: Secondary | ICD-10-CM | POA: Diagnosis not present

## 2018-10-09 ENCOUNTER — Other Ambulatory Visit: Payer: Self-pay | Admitting: Adult Health

## 2018-10-09 DIAGNOSIS — J069 Acute upper respiratory infection, unspecified: Secondary | ICD-10-CM

## 2018-10-09 MED ORDER — LEVOFLOXACIN 500 MG PO TABS: 500.0000 mg | ORAL_TABLET | Freq: Every day | ORAL | 0 refills | Status: DC

## 2018-10-12 ENCOUNTER — Telehealth: Payer: Self-pay | Admitting: Adult Health

## 2018-10-12 NOTE — Telephone Encounter (Signed)
This path that Grant [name] has been on for 4 weeks has caused him to have surgery on his lungs. He had open heart surgery. He's fever is never normal. The mother just wants to talk with [name] to see what path they should take now.  Please Advise

## 2018-10-14 ENCOUNTER — Other Ambulatory Visit: Payer: Self-pay | Admitting: Adult Health

## 2018-10-14 DIAGNOSIS — J189 Pneumonia, unspecified organism: Secondary | ICD-10-CM

## 2018-10-14 NOTE — Progress Notes (Signed)
Encounter for chest xray

## 2018-10-21 ENCOUNTER — Ambulatory Visit (INDEPENDENT_AMBULATORY_CARE_PROVIDER_SITE_OTHER): Payer: Managed Care, Other (non HMO)

## 2018-10-21 DIAGNOSIS — J181 Lobar pneumonia, unspecified organism: Secondary | ICD-10-CM

## 2018-10-21 DIAGNOSIS — J189 Pneumonia, unspecified organism: Secondary | ICD-10-CM

## 2018-10-22 ENCOUNTER — Telehealth: Payer: Self-pay

## 2018-10-22 NOTE — Telephone Encounter (Signed)
Grant [name] returning call, thinks may be about cxr but I don't see any notes.

## 2019-03-30 ENCOUNTER — Encounter: Payer: Self-pay | Admitting: Adult Health

## 2019-04-14 ENCOUNTER — Encounter: Payer: Self-pay | Admitting: Adult Health

## 2019-04-20 ENCOUNTER — Other Ambulatory Visit: Payer: Self-pay

## 2019-04-20 ENCOUNTER — Ambulatory Visit (INDEPENDENT_AMBULATORY_CARE_PROVIDER_SITE_OTHER): Payer: Managed Care, Other (non HMO)

## 2019-04-20 DIAGNOSIS — Z23 Encounter for immunization: Secondary | ICD-10-CM | POA: Diagnosis not present

## 2019-05-24 ENCOUNTER — Encounter: Payer: Self-pay | Admitting: Adult Health

## 2019-06-18 ENCOUNTER — Encounter: Payer: Self-pay | Admitting: Adult Health

## 2019-06-18 NOTE — Telephone Encounter (Signed)
Please advise 

## 2019-06-30 ENCOUNTER — Other Ambulatory Visit: Payer: Self-pay

## 2019-06-30 ENCOUNTER — Encounter: Payer: Self-pay | Admitting: Adult Health

## 2019-06-30 DIAGNOSIS — E039 Hypothyroidism, unspecified: Secondary | ICD-10-CM

## 2019-06-30 DIAGNOSIS — E785 Hyperlipidemia, unspecified: Secondary | ICD-10-CM

## 2019-07-01 ENCOUNTER — Encounter: Payer: Managed Care, Other (non HMO) | Admitting: Adult Health

## 2019-07-02 ENCOUNTER — Encounter: Payer: Self-pay | Admitting: Adult Health

## 2019-07-02 MED ORDER — LEVOTHYROXINE SODIUM 75 MCG PO TABS: 75.0000 ug | ORAL_TABLET | Freq: Every day | ORAL | 1 refills | Status: DC

## 2019-07-02 MED ORDER — ATORVASTATIN CALCIUM 40 MG PO TABS: ORAL_TABLET | ORAL | 1 refills | Status: DC

## 2019-07-02 NOTE — Telephone Encounter (Signed)
Called pt mother at provided number given by Lorella Nimrod. Pt mother stated that she was told that pt was a level 4 as far as receiving the vaccine. I advised that it was accurate. Pt mother then stated that pt should be at a higher level bc he is at higher risk than anyone in the level 4 category. I advised that there is a protocol that everyone has to follow to see what category everyone fits in. Pt mother insisted that her son be placed at a higher level after I advised on the protocol. I then articulated to her that she can call the local health department so that she can direct her questions and concerns about pt receiving the vaccine earlier. Pt mother verbalized understanding apprehensively. She then stated that pt had open heart surgery etc. and wanted to know if  she had the health department call us we can we help the pt get his vaccine now with the high risk people. I advised that we will not tell a lie to help pt receive a vaccine with high risk people. Pt mother then stated " I'm not asking you to lie I'm asking you to let them know of his hx". I advised that we will tell them the same thing that we advised her. She then stated " OH SO YOU GUYS ARE NOT GOING TO HELP".  I advised again that we will tell them the same thing we advised her. She became upset even further and yelled " OKAY IF SOMETHING HAPPENS TO MY SON AND HE DIES I WILL BE FILING A LAW SUIT". I calmly stated " okay mam have a nice day". She replied back with " Thank you same to you". No further action needed at this time.

## 2019-07-31 ENCOUNTER — Ambulatory Visit: Payer: Medicaid Other | Attending: Internal Medicine

## 2019-07-31 DIAGNOSIS — Z23 Encounter for immunization: Secondary | ICD-10-CM | POA: Insufficient documentation

## 2019-07-31 NOTE — Progress Notes (Signed)
   Covid-19 Vaccination [facility]  Name:  Donathan Buller    MRN: [id number] DOB: Feb 01, 1991  07/31/2019  Mr. Dooly was observed post Covid-19 immunization for 15 minutes without incidence. He was provided with Vaccine Information Sheet and instruction to access the V-Safe system.   Mr. Jurgens was instructed to call 911 with any severe reactions post vaccine: Marland Kitchen Difficulty breathing  . Swelling of your face and throat  . A fast heartbeat  . A bad rash all over your body  . Dizziness and weakness    Immunizations Administered    Name Date Dose VIS Date Route   Pfizer COVID-19 Vaccine 07/31/2019  5:39 PM 0.3 mL 05/14/2019 Intramuscular   Manufacturer: [employer], [employer]   Lot: [id number]   NDC: [address]-6

## 2019-09-01 ENCOUNTER — Encounter: Payer: Self-pay | Admitting: Adult Health

## 2019-09-01 ENCOUNTER — Other Ambulatory Visit: Payer: Self-pay | Admitting: Adult Health

## 2019-09-01 MED ORDER — AMOXICILLIN 500 MG PO CAPS: 2000.0000 mg | ORAL_CAPSULE | Freq: Once | ORAL | 0 refills | Status: AC

## 2020-04-18 ENCOUNTER — Ambulatory Visit: Payer: Medicaid Other

## 2020-05-17 ENCOUNTER — Encounter: Payer: Medicaid Other | Admitting: Adult Health

## 2020-05-23 ENCOUNTER — Encounter: Payer: Self-pay | Admitting: Adult Health

## 2020-06-16 ENCOUNTER — Ambulatory Visit (INDEPENDENT_AMBULATORY_CARE_PROVIDER_SITE_OTHER): Payer: Medicaid Other | Admitting: Adult Health

## 2020-06-16 ENCOUNTER — Encounter: Payer: Self-pay | Admitting: Adult Health

## 2020-06-16 ENCOUNTER — Other Ambulatory Visit: Payer: Self-pay

## 2020-06-16 ENCOUNTER — Telehealth: Payer: Self-pay | Admitting: Adult Health

## 2020-06-16 VITALS — BP 100/64 | Temp 97.9°F | Ht 60.5 in | Wt 131.0 lb

## 2020-06-16 DIAGNOSIS — Z0000 Encounter for general adult medical examination without abnormal findings: Secondary | ICD-10-CM

## 2020-06-16 DIAGNOSIS — Q212 Atrioventricular septal defect, unspecified as to partial or complete: Secondary | ICD-10-CM

## 2020-06-16 DIAGNOSIS — E785 Hyperlipidemia, unspecified: Secondary | ICD-10-CM | POA: Diagnosis not present

## 2020-06-16 DIAGNOSIS — E039 Hypothyroidism, unspecified: Secondary | ICD-10-CM

## 2020-06-16 DIAGNOSIS — Q909 Down syndrome, unspecified: Secondary | ICD-10-CM

## 2020-06-16 LAB — COMPREHENSIVE METABOLIC PANEL
ALT: 9 U/L (ref 0–53)
AST: 16 U/L (ref 0–37)
Albumin: 4.5 g/dL (ref 3.5–5.2)
Alkaline Phosphatase: 55 U/L (ref 39–117)
BUN: 22 mg/dL (ref 6–23)
CO2: 28 mEq/L (ref 19–32)
Calcium: 9.1 mg/dL (ref 8.4–10.5)
Chloride: 104 mEq/L (ref 96–112)
Creatinine, Ser: 1 mg/dL (ref 0.40–1.50)
GFR: 102.01 mL/min (ref >60.00)
Glucose, Bld: 77 mg/dL (ref 70–99)
Potassium: 3.9 mEq/L (ref 3.5–5.1)
Sodium: 139 mEq/L (ref 135–145)
Total Bilirubin: 0.8 mg/dL (ref 0.2–1.2)
Total Protein: 7.5 g/dL (ref 6.0–8.3)

## 2020-06-16 LAB — TSH: TSH: 9.91 u[IU]/mL — ABNORMAL HIGH (ref 0.35–4.50)

## 2020-06-16 LAB — CBC WITH DIFFERENTIAL/PLATELET
Basophils Absolute: 0 10*3/uL (ref 0.0–0.1)
Basophils Relative: 0.9 % (ref 0.0–3.0)
Eosinophils Absolute: 0.1 10*3/uL (ref 0.0–0.7)
Eosinophils Relative: 2.4 % (ref 0.0–5.0)
HCT: 44.2 % (ref 39.0–52.0)
Hemoglobin: 15.2 g/dL (ref 13.0–17.0)
Lymphocytes Relative: 44 % (ref 12.0–46.0)
Lymphs Abs: 1.7 10*3/uL (ref 0.7–4.0)
MCHC: 34.5 g/dL (ref 30.0–36.0)
MCV: 95.1 fl (ref 78.0–100.0)
Monocytes Absolute: 0.3 10*3/uL (ref 0.1–1.0)
Monocytes Relative: 7.7 % (ref 3.0–12.0)
Neutro Abs: 1.8 10*3/uL (ref 1.4–7.7)
Neutrophils Relative %: 45 % (ref 43.0–77.0)
Platelets: 206 10*3/uL (ref 150.0–400.0)
RBC: 4.64 Mil/uL (ref 4.22–5.81)
RDW: 14.4 % (ref 11.5–15.5)
WBC: 3.9 10*3/uL — ABNORMAL LOW (ref 4.0–10.5)

## 2020-06-16 LAB — LIPID PANEL
Cholesterol: 193 mg/dL (ref 0–200)
HDL: 38 mg/dL — ABNORMAL LOW (ref >39.00)
NonHDL: 155.41
Total CHOL/HDL Ratio: 5
Triglycerides: 202 mg/dL — ABNORMAL HIGH (ref 0.0–149.0)
VLDL: 40.4 mg/dL — ABNORMAL HIGH (ref 0.0–40.0)

## 2020-06-16 LAB — HEMOGLOBIN A1C: Hgb A1c MFr Bld: 5 % (ref 4.6–6.5)

## 2020-06-16 LAB — LDL CHOLESTEROL, DIRECT: Direct LDL: 124 mg/dL

## 2020-06-16 MED ORDER — ATORVASTATIN CALCIUM 40 MG PO TABS
ORAL_TABLET | ORAL | 3 refills | Status: DC
Start: 2020-06-16 — End: 2021-06-20

## 2020-06-16 MED ORDER — LEVOTHYROXINE SODIUM 100 MCG PO TABS
100.0000 ug | ORAL_TABLET | Freq: Every day | ORAL | 0 refills | Status: DC
Start: 2020-06-16 — End: 2020-07-27

## 2020-06-16 NOTE — Addendum Note (Signed)
Addended by: Leonette Nutting on: 06/16/2020 09:15 AM   Modules accepted: Orders

## 2020-06-16 NOTE — Telephone Encounter (Signed)
Spoke to patient's guardian, Lorella Nimrod and informed him of the labs.  Triglycerides have improved but continue to be elevated and his LDL continues to be elevated we will keep him on Lipitor 40 mg.  His TSH was 9.91, will increase his Synthroid to 100 mcg from 75 mcg.  Follow-up in 1 month for repeat labs.

## 2020-06-16 NOTE — Patient Instructions (Addendum)
It was great seeing Grant [name] today   We will check his blood work today and I will follow up with you regarding your blood work   His BMI is a little elevated, his ideal weight would be about 120 pounds.   We cleaned out his ears today as there was a little wax build up. His hearing test was normal.   Overall, his exam looks good. Keep up the good work

## 2020-06-16 NOTE — Progress Notes (Signed)
Subjective:    Patient ID: Grant [name], male    DOB: March 18, 1991, 30 y.o.   MRN: [id number]  HPI  Patient presents for yearly preventative medicine examination. He is a pleasant 30 year old male who  has a past medical history of Carbuncle and furuncle of unspecified site (08/07/2009), Congenital anomaly of heart (04/01/2008), Down's syndrome, FATIGUE (04/02/2010), Hyperlipidemia, Hypothyroidism, and Routine general medical examination at a health care facility (01/19/2016).   He presents to the office today with his legal guardian d/t h/o of Downs Syndrome  Hypothyroidism - Takes synthroid 75 mcg daily. This dose was increased 50 to 75 mg during his last CPE, unfortunately never followed up to have repeat labs done Lab Results  Component Value Date   TSH 11.56 (H) 01/22/2017    Hyperlipidemia - takes lipitor 40 mg. He denies myalgia or fatigue  Lab Results  Component Value Date   CHOL 184 07/13/2018   CHOL 104 08/04/2017   CHOL 135 01/22/2017   Lab Results  Component Value Date   HDL 30.40 (L) 07/13/2018   HDL 25.20 (L) 08/04/2017   HDL 27.10 (L) 01/22/2017   No results found for: [facility] Lab Results  Component Value Date   TRIG 287.0 (H) 07/13/2018   TRIG 242.0 (H) 08/04/2017   TRIG 337.0 (H) 01/22/2017   Lab Results  Component Value Date   CHOLHDL 6 07/13/2018   CHOLHDL 4 08/04/2017   CHOLHDL 5 01/22/2017   Lab Results  Component Value Date   LDLDIRECT 108.0 07/13/2018   LDLDIRECT 45.0 08/04/2017   LDLDIRECT 56.0 01/22/2017   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  Had corrective surgery at 4-1/2 months at the [facility] [place] and was previously seen at [facility].  Was seen by cardiology in 2018 which time he had a normal echo.  He denies chest pain or shortness of breath  All immunizations and health maintenance protocols were reviewed with the patient and needed orders were placed.  Appropriate  screening laboratory values were ordered for the patient including screening of hyperlipidemia, renal function and hepatic function.  Medication reconciliation,  past medical history, social history, problem list and allergies were reviewed in detail with the patient  Goals were established with regard to weight loss, exercise, and  diet in compliance with medications.  He does eat a heart healthy diet and his caregiver make sure he stays active. Wt Readings from Last 3 Encounters:  06/16/20 131 lb (59.4 kg)  09/25/18 131 lb (59.4 kg)  03/13/18 135 lb (61.2 kg)   Review of Systems  Unable to perform ROS: Other   Past Medical History:  Diagnosis Date  . Carbuncle and furuncle of unspecified site 08/07/2009   Qualifier: Diagnosis of  By: Tawanna Cooler MD, Eugenio Hoes   . 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 Down's syndrome   . FATIGUE 04/02/2010   Qualifier: Diagnosis of  By: Tawanna Cooler MD, Eugenio Hoes   . Hyperlipidemia   . Hypothyroidism   . Routine general medical examination at a health care facility 01/19/2016    Social History   Socioeconomic History  . Marital status: Single    Spouse name: Not on file  . Number of children: Not on file  . Years of education: Not on file  . Highest education level: Not on file  Occupational History  . Not on file  Tobacco Use  . Smoking  status: Never Smoker  . Smokeless tobacco: Never Used  Vaping Use  . Vaping Use: Never used  Substance and Sexual Activity  . Alcohol use: No    Alcohol/week: 0.0 standard drinks  . Drug use: No  . Sexual activity: Not on file  Other Topics Concern  . Not on file  Social History Narrative  . Not on file   Social Determinants of Health   Financial Resource Strain: Not on file  Food Insecurity: Not on file  Transportation Needs: Not on file  Physical Activity: Not on file  Stress: Not on file  Social Connections: Not on file  Intimate Partner Violence:  Not on file    Past Surgical History:  Procedure Laterality Date  . CARDIAC SURGERY     AV canal VSD repair  . HERNIA REPAIR    . LUNG SURGERY     drcortication of right lung empyema    Family History  Problem Relation Age of Onset  . Hyperlipidemia Mother   . Hyperlipidemia Paternal Grandmother   . Heart attack Paternal Grandfather     Allergies  Allergen Reactions  . Codeine   . Doxycycline   . Penicillins     Current Outpatient Medications on File Prior to Visit  Medication Sig Dispense Refill  . atorvastatin (LIPITOR) 40 MG tablet TAKE ONE TABLET BY MOUTH DAILY AT 6 PM 90 tablet 1  . ketoconazole (NIZORAL) 2 % shampoo Apply 1 application topically 2 (two) times a week. 120 mL 5  . levothyroxine (SYNTHROID) 75 MCG tablet Take 1 tablet (75 mcg total) by mouth daily before breakfast. 90 tablet 1   No current facility-administered medications on file prior to visit.    BP 100/64   Temp 97.9 F (36.6 C)   Ht 5' 0.5" (1.537 m) Comment: WITH SHOES  Wt 131 lb (59.4 kg)   BMI 25.16 kg/m       Objective:   Physical Exam Vitals and nursing note reviewed.  Constitutional:      General: He is not in acute distress.    Appearance: Normal appearance. He is well-developed and normal weight.  HENT:     Head: Normocephalic and atraumatic.     Right Ear: Tympanic membrane, ear canal and external ear normal. There is no impacted cerumen.     Left Ear: Tympanic membrane, ear canal and external ear normal. There is no impacted cerumen.     Nose: Nose normal. No congestion or rhinorrhea.     Mouth/Throat:     Mouth: Mucous membranes are moist.     Pharynx: Oropharynx is clear. No oropharyngeal exudate or posterior oropharyngeal erythema.  Eyes:     General:        Right eye: No discharge.        Left eye: No discharge.     Extraocular Movements: Extraocular movements intact.     Conjunctiva/sclera: Conjunctivae normal.     Pupils: Pupils are equal, round, and reactive to  light.  Neck:     Vascular: No carotid bruit.     Trachea: No tracheal deviation.  Cardiovascular:     Rate and Rhythm: Normal rate and regular rhythm.     Pulses: Normal pulses.     Heart sounds: Normal heart sounds. No murmur heard. No friction rub. No gallop.   Pulmonary:     Effort: Pulmonary effort is normal. No respiratory distress.     Breath sounds: Normal breath sounds. No stridor. No wheezing, rhonchi or rales.  Chest:     Chest wall: No tenderness.  Abdominal:     General: Bowel sounds are normal. There is no distension.     Palpations: Abdomen is soft. There is no mass.     Tenderness: There is no abdominal tenderness. There is no right CVA tenderness, left CVA tenderness, guarding or rebound.     Hernia: No hernia is present.  Musculoskeletal:        General: No swelling, tenderness, deformity or signs of injury. Normal range of motion.     Right lower leg: No edema.     Left lower leg: No edema.  Lymphadenopathy:     Cervical: No cervical adenopathy.  Skin:    General: Skin is warm and dry.     Capillary Refill: Capillary refill takes less than 2 seconds.     Coloration: Skin is not jaundiced or pale.     Findings: No bruising, erythema, lesion or rash.  Neurological:     General: No focal deficit present.     Mental Status: He is alert and oriented to person, place, and time.     Cranial Nerves: No cranial nerve deficit.     Sensory: No sensory deficit.     Motor: No weakness.     Coordination: Coordination normal.     Gait: Gait normal.     Deep Tendon Reflexes: Reflexes normal.  Psychiatric:        Mood and Affect: Mood normal.        Behavior: Behavior normal.        Thought Content: Thought content normal.        Judgment: Judgment normal.       Assessment & Plan:  1. Routine general medical examination at a health care facility - Overall, benign exam.  - Continue to exercise and eat a heart healthy diet  - Follow up in one year as directed - CBC  with Differential/Platelet; Future - Comprehensive metabolic panel; Future - Hemoglobin A1c; Future - Lipid panel; Future - TSH; Future  2. Hyperlipidemia, unspecified hyperlipidemia type - Consider dose change of statin or to add fenofirate  - CBC with Differential/Platelet; Future - Comprehensive metabolic panel; Future - Hemoglobin A1c; Future - Lipid panel; Future - TSH; Future  3. Acquired hypothyroidism - Consider increasing dose of synthroid  - CBC with Differential/Platelet; Future - Comprehensive metabolic panel; Future - Hemoglobin A1c; Future - Lipid panel; Future - TSH; Future  4. Atrioventricular canal type ventricular septal defect - Follow up with Cardiology as directed  - CBC with Differential/Platelet; Future - Comprehensive metabolic panel; Future - Hemoglobin A1c; Future - Lipid panel; Future - TSH; Future  5. DOWNS SYNDROME   Shirline Frees, NP

## 2020-07-15 ENCOUNTER — Ambulatory Visit (HOSPITAL_COMMUNITY)
Admission: EM | Admit: 2020-07-15 | Discharge: 2020-07-15 | Disposition: A | Discharge status: Home / Self Care | Payer: 59 | Attending: Family Medicine | Admitting: Family Medicine

## 2020-07-15 ENCOUNTER — Encounter (HOSPITAL_COMMUNITY): Payer: Self-pay

## 2020-07-15 ENCOUNTER — Other Ambulatory Visit: Payer: Self-pay

## 2020-07-15 ENCOUNTER — Ambulatory Visit (INDEPENDENT_AMBULATORY_CARE_PROVIDER_SITE_OTHER): Payer: 59

## 2020-07-15 DIAGNOSIS — M79671 Pain in right foot: Secondary | ICD-10-CM | POA: Diagnosis not present

## 2020-07-15 MED ORDER — IBUPROFEN 800 MG PO TABS: 800.0000 mg | ORAL_TABLET | Freq: Three times a day (TID) | ORAL | 0 refills | Status: DC

## 2020-07-15 NOTE — ED Triage Notes (Signed)
Pt c/o pain to right foot. Pt's caregiver states that pt was walking normally last night, but caregiver noticed this morning pt was standing in his bedroom doorway and would not put weight onto right foot. Caregiver reports that pt typed on his communication I-pad "[place], bedroom" and caregiver deducted that pt struck his foot on his bed or furniture.  No tylenol or ibuprofen PTA. Cap refill brisk, edema noted to right anterior foot/metatarsal area

## 2020-07-15 NOTE — ED Provider Notes (Signed)
[facility]    CSN: [id number] Arrival date & time: 07/15/20  1148      History   Chief Complaint Chief Complaint  Patient presents with  . Foot Injury    HPI Grant [name] is a 30 y.o. male. Pt is accompanied by his caregiver as patient is non-verbal.   HPI   Foot Injury: Caregiver states that this morning patient was standing in his doorway. He reports that he would not walk as he would not put weight on his right foot. Suspected to be due to pain. Per caregiver Dontrel typed " [place], bedroom" on his ipad which he uses for communication- caregiver suspects that he accidentally hit his foot on his bed of furniture. They have not tried anything for pain as he appears to be comfortable when not walking. No previous history of this area.     Past Medical History:  Diagnosis Date  . Carbuncle and furuncle of unspecified site 08/07/2009   Qualifier: Diagnosis of  By: Tawanna Cooler MD, Eugenio Hoes   . 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 Down's syndrome   . FATIGUE 04/02/2010   Qualifier: Diagnosis of  By: Tawanna Cooler MD, Eugenio Hoes   . Hyperlipidemia   . Hypothyroidism   . Routine general medical examination at a health care facility 01/19/2016    Patient Active Problem List   Diagnosis Date Noted  . Atrioventricular canal type ventricular septal defect 02/26/2017  . Hyperlipidemia 01/19/2016  . Routine general medical examination at a health care facility 01/19/2016  . FATIGUE 04/02/2010  . CARBUNCLE AND FURUNCLE OF UNSPECIFIED SITE 08/07/2009  . 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 DOWNS SYNDROME 04/01/2008    Past Surgical History:  Procedure Laterality Date  . CARDIAC SURGERY     AV canal VSD repair  . HERNIA REPAIR    . LUNG SURGERY     drcortication of right lung empyema       Home Medications    Prior to Admission medications   Medication Sig Start Date End Date Taking? Authorizing Provider   atorvastatin (LIPITOR) 40 MG tablet TAKE ONE TABLET BY MOUTH DAILY AT 6 PM 06/16/20  Yes Nafziger, [name], NP  levothyroxine (SYNTHROID) 100 MCG tablet Take 1 tablet (100 mcg total) by mouth daily before breakfast. 06/16/20  Yes Nafziger, [name], NP  ketoconazole (NIZORAL) 2 % shampoo Apply 1 application topically 2 (two) times a week. 01/23/17   Shirline Frees, NP    Family History Family History  Problem Relation Age of Onset  . Hyperlipidemia Mother   . Hyperlipidemia Paternal Grandmother   . Heart attack Paternal Grandfather     Social History Social History   Tobacco Use  . Smoking status: Never Smoker  . Smokeless tobacco: Never Used  Vaping Use  . Vaping Use: Never used  Substance Use Topics  . Alcohol use: No    Alcohol/week: 0.0 standard drinks  . Drug use: No     Allergies   Codeine, Doxycycline, and Penicillins   Review of Systems Review of Systems  As stated above in HPI Physical Exam Triage Vital Signs ED Triage Vitals [07/15/20 1213]  Enc Vitals Group     BP 108/71     Pulse Rate 94     Resp 18     Temp 99 F (37.2 C)     Temp Source Oral     SpO2 98 %  Weight      Height      Head Circumference      Peak Flow      Pain Score      Pain Loc      Pain Edu?      Excl. in GC?    No data found.  Updated Vital Signs BP 108/71 (BP Location: Right Arm)   Pulse 94   Temp 99 F (37.2 C) (Oral)   Resp 18   SpO2 98%   Physical Exam Vitals and nursing note reviewed.  Constitutional:      General: He is not in acute distress.    Appearance: He is not ill-appearing, toxic-appearing or diaphoretic.  Musculoskeletal:        General: Swelling (mild swelling of the right distal foot) and tenderness (Moderate tenderness over the distal-medial foot ) present. No deformity or signs of injury. Normal range of motion.     Right lower leg: No edema.     Left lower leg: No edema.  Skin:    Coloration: Skin is not pale.     Findings: No bruising,  erythema, lesion or rash.  Neurological:     General: No focal deficit present.     Mental Status: He is alert.     Motor: No weakness.     Gait: Gait abnormal (limp of the right foot).      UC Treatments / Results  Labs (all labs ordered are listed, but only abnormal results are displayed) Labs Reviewed - No data to display  EKG   Radiology No results found.  Procedures Procedures (including critical care time)  Medications Ordered in UC Medications - No data to display  Initial Impression / Assessment and Plan / UC Course  I have reviewed the triage vital signs and the nursing notes.  Pertinent labs & imaging results that were available during my care of the patient were reviewed by me and considered in my medical decision making (see chart for details).     New. X ray is negative for acute abnormality. Likely contusion. Ibuprofen or tylenol along with walking shoe. Should heal over the next 2 weeks. Discussed red flag signs and symptoms.   Final Clinical Impressions(s) / UC Diagnoses   Final diagnoses:  None   Discharge Instructions   None    ED Prescriptions    None     PDMP not reviewed this encounter.   Rushie Chestnut, [place] 07/15/20 1300

## 2020-07-23 ENCOUNTER — Other Ambulatory Visit: Payer: Self-pay | Admitting: Adult Health

## 2020-07-23 DIAGNOSIS — E039 Hypothyroidism, unspecified: Secondary | ICD-10-CM

## 2020-07-26 NOTE — Telephone Encounter (Signed)
Was pt to return for lab work?

## 2020-07-26 NOTE — Telephone Encounter (Signed)
Left a message for Grant [name] to return my call.

## 2020-07-26 NOTE — Telephone Encounter (Signed)
Was supposed to be in 3-4 weeks after starting new dose of medication. If he needs another refill send in so that the blood work is accurate that is fine but needs to come in for blood work. The lab was entered

## 2020-07-27 ENCOUNTER — Other Ambulatory Visit: Payer: Self-pay

## 2020-07-27 ENCOUNTER — Other Ambulatory Visit (INDEPENDENT_AMBULATORY_CARE_PROVIDER_SITE_OTHER): Payer: 59

## 2020-07-27 ENCOUNTER — Other Ambulatory Visit: Payer: Self-pay | Admitting: Adult Health

## 2020-07-27 DIAGNOSIS — E039 Hypothyroidism, unspecified: Secondary | ICD-10-CM

## 2020-07-27 LAB — TSH: TSH: 3.59 u[IU]/mL (ref 0.35–4.50)

## 2020-07-27 NOTE — Telephone Encounter (Signed)
Pt is scheduled for lab work today at 3 PM.  Will hold request.

## 2020-07-28 ENCOUNTER — Encounter: Payer: Self-pay | Admitting: Adult Health

## 2020-11-01 ENCOUNTER — Encounter: Payer: Self-pay | Admitting: Adult Health

## 2020-11-03 ENCOUNTER — Encounter: Payer: Self-pay | Admitting: Adult Health

## 2020-11-03 ENCOUNTER — Ambulatory Visit (INDEPENDENT_AMBULATORY_CARE_PROVIDER_SITE_OTHER): Payer: 59 | Admitting: Adult Health

## 2020-11-03 VITALS — BP 110/74 | HR 72 | Temp 98.5°F | Ht 60.5 in | Wt 127.0 lb

## 2020-11-03 DIAGNOSIS — J0140 Acute pansinusitis, unspecified: Secondary | ICD-10-CM

## 2020-11-03 MED ORDER — AZITHROMYCIN 250 MG PO TABS: ORAL_TABLET | ORAL | 0 refills | Status: DC

## 2020-11-03 NOTE — Progress Notes (Signed)
Subjective:    Patient ID: Grant [name], male    DOB: [birth year], 30 y.o.   MRN: [id number]  HPI 30 year old male who  has a past medical history of Carbuncle and furuncle of unspecified site (08/07/2009), Congenital anomaly of heart (04/01/2008), Down's syndrome, FATIGUE (04/02/2010), Hyperlipidemia, Hypothyroidism, and Routine general medical examination at a health care facility (01/19/2016).  He presents with his caregiver today for an acute issues. His symptoms started a roughly 7-10 days ago. His symptoms include that of productive cough, chills, and nasal congestion with discolored nasal drainage. Symptoms have been waxing and weaning throughout the week.   He had two negative Covid tests done this week   Review of Systems See HPI   Past Medical History:  Diagnosis Date  . Carbuncle and furuncle of unspecified site 08/07/2009   Qualifier: Diagnosis of  By: Tawanna Cooler MD, Eugenio Hoes   . 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 Down's syndrome   . FATIGUE 04/02/2010   Qualifier: Diagnosis of  By: Tawanna Cooler MD, Eugenio Hoes   . Hyperlipidemia   . Hypothyroidism   . Routine general medical examination at a health care facility 01/19/2016    Social History   Socioeconomic History  . Marital status: Single    Spouse name: Not on file  . Number of children: Not on file  . Years of education: Not on file  . Highest education level: Not on file  Occupational History  . Not on file  Tobacco Use  . Smoking status: Never Smoker  . Smokeless tobacco: Never Used  Vaping Use  . Vaping Use: Never used  Substance and Sexual Activity  . Alcohol use: No    Alcohol/week: 0.0 standard drinks  . Drug use: No  . Sexual activity: Not on file  Other Topics Concern  . Not on file  Social History Narrative  . Not on file   Social Determinants of Health   Financial Resource Strain: Not on file  Food Insecurity: Not on file  Transportation  Needs: Not on file  Physical Activity: Not on file  Stress: Not on file  Social Connections: Not on file  Intimate Partner Violence: Not on file    Past Surgical History:  Procedure Laterality Date  . CARDIAC SURGERY     AV canal VSD repair  . HERNIA REPAIR    . LUNG SURGERY     drcortication of right lung empyema    Family History  Problem Relation Age of Onset  . Hyperlipidemia Mother   . Hyperlipidemia Paternal Grandmother   . Heart attack Paternal Grandfather     Allergies  Allergen Reactions  . Codeine   . Doxycycline   . Penicillins     Current Outpatient Medications on File Prior to Visit  Medication Sig Dispense Refill  . atorvastatin (LIPITOR) 40 MG tablet TAKE ONE TABLET BY MOUTH DAILY AT 6 PM 90 tablet 3  . ibuprofen (ADVIL) 800 MG tablet Take 1 tablet (800 mg total) by mouth 3 (three) times daily. 21 tablet 0  . ketoconazole (NIZORAL) 2 % shampoo Apply 1 application topically 2 (two) times a week. 120 mL 5  . levothyroxine (SYNTHROID) 100 MCG tablet TAKE 1 TABLET BY MOUTH DAILY BEFORE BREAKFAST 90 tablet 3   No current facility-administered medications on file prior to visit.    Temp 98.5 F (36.9 C) (Oral)   Ht 5' 0.5" (1.537 m)  Wt 127 lb (57.6 kg)   BMI 24.39 kg/m       Objective:   Physical Exam Vitals and nursing note reviewed.  Constitutional:      Appearance: Normal appearance.  HENT:     Nose: Congestion and rhinorrhea present. Rhinorrhea is purulent.     Right Turbinates: Enlarged.     Left Turbinates: Enlarged.  Cardiovascular:     Pulses: Normal pulses.     Heart sounds: Normal heart sounds.  Pulmonary:     Effort: Pulmonary effort is normal.     Breath sounds: Normal breath sounds.  Skin:    General: Skin is warm and dry.     Capillary Refill: Capillary refill takes less than 2 seconds.  Neurological:     General: No focal deficit present.     Mental Status: He is alert and oriented to person, place, and time.   Psychiatric:        Mood and Affect: Mood normal.        Behavior: Behavior normal.       Assessment & Plan:  1. Acute non-recurrent pansinusitis - Will treat due to symptoms. Does not tolerate doxy or PCN well.  - Follow up if no improvement in the next 2-3 days  - azithromycin (ZITHROMAX Z-PAK) 250 MG tablet; Take 2 tablets on Day 1.  Then take 1 tablet daily.  Dispense: 6 tablet; Refill: 0  Shirline Frees, NP

## 2021-01-25 ENCOUNTER — Encounter: Payer: Self-pay | Admitting: Emergency Medicine

## 2021-01-25 ENCOUNTER — Ambulatory Visit
Admission: EM | Admit: 2021-01-25 | Discharge: 2021-01-25 | Disposition: A | Discharge status: Home / Self Care | Payer: 59 | Attending: Emergency Medicine | Admitting: Emergency Medicine

## 2021-01-25 ENCOUNTER — Other Ambulatory Visit: Payer: Self-pay

## 2021-01-25 DIAGNOSIS — H1820 Unspecified corneal edema: Secondary | ICD-10-CM | POA: Diagnosis not present

## 2021-01-25 MED ORDER — SODIUM CHLORIDE (HYPERTONIC) 5 % OP SOLN: 1.0000 [drp] | OPHTHALMIC | 0 refills | Status: DC | PRN

## 2021-01-25 NOTE — ED Triage Notes (Signed)
Patient presents to [facility] for evaluation of left eye redness and swelling since waking this morning.  Denies discharge

## 2021-01-25 NOTE — Discharge Instructions (Addendum)
Please use eyedrops every 4 hours as needed for irritation Please go to Dr. Alben Spittle office tomorrow morning at 845

## 2021-01-25 NOTE — ED Provider Notes (Signed)
[facility]    CSN: [id number] Arrival date & time: 01/25/21  1435      History   Chief Complaint Chief Complaint  Patient presents with   Conjunctivitis    HPI Elie Leppo is a 30 y.o. male history of Down syndrome, presenting today for evaluation of left eye redness and discoloration.  Patient presents with caregiver who provides majority of history as patient is largely nonverbal.  Denies contact use but does believe that he may be near farsighted.  Reports this morning noticed a cloudiness over his eye which was not present previously.  Patient has been rubbing eye frequently.  Denies any drainage.  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   Qualifier: Diagnosis of  By: Tawanna Cooler MD, Eugenio Hoes    Down's syndrome    FATIGUE 04/02/2010   Qualifier: Diagnosis of  By: Tawanna Cooler MD, Eugenio Hoes    Hyperlipidemia    Hypothyroidism    Routine general medical examination at a health care facility 01/19/2016    Patient Active Problem List   Diagnosis Date Noted   Atrioventricular canal type ventricular septal defect 02/26/2017   Hyperlipidemia 01/19/2016   Routine general medical examination at a health care facility 01/19/2016   FATIGUE 04/02/2010   CARBUNCLE AND FURUNCLE OF UNSPECIFIED SITE 08/07/2009   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 Authorizing Provider  sodium chloride (MURO 128) 5 % ophthalmic solution Place 1-2 drops into the left eye every 4 (four) hours as needed for eye irritation. 01/25/21  Yes Melony Tenpas,  Jaksen Fiorella C, PA-C  atorvastatin (LIPITOR) 40 MG tablet TAKE ONE TABLET BY MOUTH DAILY AT 6 PM 06/16/20   Nafziger, [name], NP  ibuprofen (ADVIL) 800 MG tablet Take 1 tablet (800 mg total) by mouth 3 (three) times daily. 07/15/20   Rushie Chestnut, PA-C  ketoconazole (NIZORAL) 2 % shampoo Apply 1 application topically 2 (two) times a week. 01/23/17   Nafziger, [name], NP  levothyroxine (SYNTHROID) 100 MCG tablet TAKE 1 TABLET BY MOUTH DAILY BEFORE BREAKFAST 07/27/20   Nafziger, [name], NP    Family History Family History  Problem Relation Age of Onset   Hyperlipidemia Mother    Hyperlipidemia Paternal Grandmother    Heart attack Paternal Grandfather     Social History Social History   Tobacco Use   Smoking status: Never   Smokeless tobacco: Never  Vaping Use   Vaping Use: Never used  Substance Use Topics   Alcohol use: No    Alcohol/week: 0.0 standard drinks   Drug use: No     Allergies   Codeine, Doxycycline, and Penicillins   Review of Systems Review of Systems  Constitutional:  Negative for fatigue and fever.  Eyes:  Positive for pain and redness. Negative for itching and visual disturbance.  Respiratory:  Negative for shortness of breath.   Cardiovascular:  Negative for chest pain and leg swelling.  Gastrointestinal:  Negative for nausea  and vomiting.  Musculoskeletal:  Negative for arthralgias and myalgias.  Skin:  Negative for color change, rash and wound.  Neurological:  Negative for dizziness, syncope, weakness, light-headedness and headaches.    Physical Exam Triage Vital Signs ED Triage Vitals [01/25/21 1451]  Enc Vitals Group     BP 95/63     Pulse Rate 66     Resp 18     Temp 98.2 F (36.8 C)     Temp Source Oral     SpO2 96 %     Weight      Height      Head Circumference      Peak Flow      Pain Score      Pain Loc      Pain Edu?      Excl. in GC?    No data found.  Updated Vital Signs BP 95/63 (BP Location: Left Arm)   Pulse 66   Temp 98.2 F  (36.8 C) (Oral)   Resp 18   SpO2 96%   Visual Acuity Right Eye Distance:   Left Eye Distance:   Bilateral Distance:    Right Eye Near:   Left Eye Near:    Bilateral Near:     Physical Exam Vitals and nursing note reviewed.  Constitutional:      Appearance: He is well-developed.     Comments: No acute distress  HENT:     Head: Normocephalic and atraumatic.     Nose: Nose normal.  Eyes:     Comments: Left eye with mild periorbital erythema without swelling or induration, cornea with swelling and cloudy discoloration, minimal conjunctival erythema, no discharge noted, no photophobia with exam  Cardiovascular:     Rate and Rhythm: Normal rate.  Pulmonary:     Effort: Pulmonary effort is normal. No respiratory distress.  Abdominal:     General: There is no distension.  Musculoskeletal:        General: Normal range of motion.     Cervical back: Neck supple.  Skin:    General: Skin is warm and dry.  Neurological:     Mental Status: He is alert and oriented to person, place, and time.        UC Treatments / Results  Labs (all labs ordered are listed, but only abnormal results are displayed) Labs Reviewed - No data to display  EKG   Radiology No results found.  Procedures Procedures (including critical care time)  Medications Ordered in UC Medications - No data to display  Initial Impression / Assessment and Plan / UC Course  I have reviewed the triage vital signs and the nursing notes.  Pertinent labs & imaging results that were available during my care of the patient were reviewed by me and considered in my medical decision making (see chart for details).     Discussed case with Dr. Alben Spittle who plans to see patient tomorrow morning at 8:45 AM.  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  Recommended Muro 128 drops in the interim.  Provided caregiver with follow-up information.   Discussed strict return precautions. Patient verbalized  understanding and is agreeable with plan.    Final Clinical Impressions(s) / UC Diagnoses   Final diagnoses:  Cornea edema     Discharge Instructions      Please use eyedrops every 4 hours as needed for irritation Please go to Dr. Alben Spittle office tomorrow morning at 845      ED Prescriptions  Medication Sig Dispense Auth. Provider   sodium chloride (MURO 128) 5 % ophthalmic solution Place 1-2 drops into the left eye every 4 (four) hours as needed for eye irritation. 15 mL Amariya Liskey, [place] C, PA-C      PDMP not reviewed this encounter.   Lew Dawes, [place] 01/26/21 [phone]

## 2021-01-31 DIAGNOSIS — H18622 Keratoconus, unstable, left eye: Secondary | ICD-10-CM | POA: Insufficient documentation

## 2021-01-31 HISTORY — DX: Keratoconus, unstable, left eye: H18.622

## 2021-03-26 ENCOUNTER — Other Ambulatory Visit: Payer: Self-pay

## 2021-03-26 ENCOUNTER — Ambulatory Visit (INDEPENDENT_AMBULATORY_CARE_PROVIDER_SITE_OTHER): Payer: 59 | Admitting: Podiatry

## 2021-03-26 DIAGNOSIS — M10072 Idiopathic gout, left ankle and foot: Secondary | ICD-10-CM | POA: Diagnosis not present

## 2021-03-26 NOTE — Progress Notes (Signed)
   HPI: 30 y.o. male nonverbal with a history of Down syndrome presenting today with his caregiver for evaluation of left foot swelling and pain that is been going on for about 1 month now.  No injury.  Patient's caregiver states that he does stop his foot significantly.  He says that he does have intermittent times where his foot is tender and he is limping on it.  He first notices this in the middle of the night or first thing in the morning.  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  Dermatology: Skin is warm, dry and supple bilateral lower extremities. Negative for open lesions or macerations.  Vascular: Palpable pedal pulses bilaterally.  There is some mildly increased edema and erythema localized around the first MTP joint of the left foot capillary refill within normal limits.  Neurological: Epicritic and protective threshold grossly intact bilaterally.   Musculoskeletal Exam: No pedal deformities noted.   Assessment: 1.  Suspicion for possible gout first MTPJ left   Plan of Care:  1. Patient evaluated.  2.  Explained the etiology and pathology of gout.  This seems to be appropriate for this patient.  Recommend that the caregiver educates himself on certain foods to avoid associated with gout. 3.  Today were going to pursue simple conservative treatment including diet modifications.  Patient is very healthy and has lost a significant amount of weight  with his caregiver.  Simple modification of some of the foods and vegetables that may be high in purine content 4.  Return to clinic as needed      Felecia Shelling, DPM [facility]  Dr. Felecia Shelling, DPM    [address] [place], [place] [address]                Office [phone]  Fax [phone]

## 2021-06-20 ENCOUNTER — Ambulatory Visit (INDEPENDENT_AMBULATORY_CARE_PROVIDER_SITE_OTHER): Payer: 59 | Admitting: Adult Health

## 2021-06-20 ENCOUNTER — Encounter: Payer: Self-pay | Admitting: Adult Health

## 2021-06-20 ENCOUNTER — Telehealth: Payer: Self-pay | Admitting: Adult Health

## 2021-06-20 VITALS — BP 102/60 | HR 77 | Temp 98.8°F | Ht 59.05 in | Wt 128.0 lb

## 2021-06-20 DIAGNOSIS — Q212 Atrioventricular septal defect, unspecified as to partial or complete: Secondary | ICD-10-CM

## 2021-06-20 DIAGNOSIS — Q909 Down syndrome, unspecified: Secondary | ICD-10-CM

## 2021-06-20 DIAGNOSIS — E785 Hyperlipidemia, unspecified: Secondary | ICD-10-CM

## 2021-06-20 DIAGNOSIS — Z23 Encounter for immunization: Secondary | ICD-10-CM

## 2021-06-20 DIAGNOSIS — Z0000 Encounter for general adult medical examination without abnormal findings: Secondary | ICD-10-CM

## 2021-06-20 DIAGNOSIS — E039 Hypothyroidism, unspecified: Secondary | ICD-10-CM

## 2021-06-20 LAB — CBC WITH DIFFERENTIAL/PLATELET
Basophils Absolute: 0 10*3/uL (ref 0.0–0.1)
Basophils Relative: 0.8 % (ref 0.0–3.0)
Eosinophils Absolute: 0.1 10*3/uL (ref 0.0–0.7)
Eosinophils Relative: 2.4 % (ref 0.0–5.0)
HCT: 40.6 % (ref 39.0–52.0)
Hemoglobin: 13.9 g/dL (ref 13.0–17.0)
Lymphocytes Relative: 39.2 % (ref 12.0–46.0)
Lymphs Abs: 1.7 10*3/uL (ref 0.7–4.0)
MCHC: 34.3 g/dL (ref 30.0–36.0)
MCV: 97.3 fl (ref 78.0–100.0)
Monocytes Absolute: 0.3 10*3/uL (ref 0.1–1.0)
Monocytes Relative: 8.1 % (ref 3.0–12.0)
Neutro Abs: 2.1 10*3/uL (ref 1.4–7.7)
Neutrophils Relative %: 49.5 % (ref 43.0–77.0)
Platelets: 222 10*3/uL (ref 150.0–400.0)
RBC: 4.18 Mil/uL — ABNORMAL LOW (ref 4.22–5.81)
RDW: 14.4 % (ref 11.5–15.5)
WBC: 4.3 10*3/uL (ref 4.0–10.5)

## 2021-06-20 LAB — LIPID PANEL
Cholesterol: 205 mg/dL — ABNORMAL HIGH (ref 0–200)
HDL: 29.9 mg/dL — ABNORMAL LOW (ref >39.00)
Total CHOL/HDL Ratio: 7
Triglycerides: 472 mg/dL — ABNORMAL HIGH (ref 0.0–149.0)

## 2021-06-20 LAB — COMPREHENSIVE METABOLIC PANEL
ALT: 8 U/L (ref 0–53)
AST: 17 U/L (ref 0–37)
Albumin: 4.3 g/dL (ref 3.5–5.2)
Alkaline Phosphatase: 50 U/L (ref 39–117)
BUN: 27 mg/dL — ABNORMAL HIGH (ref 6–23)
CO2: 28 mEq/L (ref 19–32)
Calcium: 9.4 mg/dL (ref 8.4–10.5)
Chloride: 106 mEq/L (ref 96–112)
Creatinine, Ser: 1.15 mg/dL (ref 0.40–1.50)
GFR: 85.65 mL/min (ref >60.00)
Glucose, Bld: 81 mg/dL (ref 70–99)
Potassium: 4 mEq/L (ref 3.5–5.1)
Sodium: 141 mEq/L (ref 135–145)
Total Bilirubin: 0.5 mg/dL (ref 0.2–1.2)
Total Protein: 7.5 g/dL (ref 6.0–8.3)

## 2021-06-20 LAB — TSH: TSH: 18.14 u[IU]/mL — ABNORMAL HIGH (ref 0.35–5.50)

## 2021-06-20 LAB — HEMOGLOBIN A1C: Hgb A1c MFr Bld: 5.2 % (ref 4.6–6.5)

## 2021-06-20 LAB — LDL CHOLESTEROL, DIRECT: Direct LDL: 128 mg/dL

## 2021-06-20 MED ORDER — ATORVASTATIN CALCIUM 40 MG PO TABS: ORAL_TABLET | ORAL | 3 refills | Status: DC

## 2021-06-20 MED ORDER — LEVOTHYROXINE SODIUM 100 MCG PO TABS: 100.0000 ug | ORAL_TABLET | Freq: Every day | ORAL | 0 refills | Status: DC

## 2021-06-20 NOTE — Telephone Encounter (Signed)
Updated patient's caregiver on recent labs.  His TSH was 18.14, indicative of not him not taking his medication and his triglycerides had increased to 472.  He was at his mother's house over the holidays and usually when he is with his mom he does not eat healthy nor take his medications on a routine basis.  We will recheck his TSH and lipid panel in 3 weeks

## 2021-06-20 NOTE — Patient Instructions (Signed)
It was great seeing you today   We will follow up with you regarding your lab work   Please let me know if you need anything   

## 2021-06-20 NOTE — Progress Notes (Signed)
Subjective:    Patient ID: Grant HampshireDonald [name], male    DOB: Mar 24, 1991, 31 y.o.   MRN: [id number]  HPI  Patient presents for yearly preventative medicine examination. 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  He presents to the office today with her legal guardian due to history of Down syndrome.  Hypothyroidism-takes Synthroid 100 mcg daily. Lab Results  Component Value Date   TSH 3.59 07/27/2020   Hyperlipidemia-takes Lipitor 40 mg daily.  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  He had corrective surgery at 4-1/2 months at [facility] and [facility], he was last seen by cardiology in 2018.  Denies activity intolerance, chest pain, shortness of breath  All immunizations and health maintenance protocols were reviewed with the patient and needed orders were placed.  Appropriate screening laboratory values were ordered for the patient including screening of hyperlipidemia, renal function and hepatic function.   Medication reconciliation,  past medical history, social history, problem list and allergies were reviewed in detail with the patient  Goals were established with regard to weight loss, exercise, and  diet in compliance with medications.  His legal guardian cooks high-quality heart healthy foods and make sure that [employer] stays active.  Wt Readings from Last 3 Encounters:  06/20/21 128 lb (58.1 kg)  11/03/20 127 lb (57.6  kg)  06/16/20 131 lb (59.4 kg)   Review of Systems  Unable to perform ROS: Patient nonverbal   Past Medical History:  Diagnosis Date   Carbuncle and furuncle of unspecified site 08/07/2009   Qualifier: Diagnosis of  By: Tawanna Coolerodd MD, Tinnie GensJeffrey A    Congenital anomaly of heart 04/01/2008   Qualifier: Diagnosis of  By: Tawanna Coolerodd MD, Eugenio HoesJeffrey A    Down's syndrome    FATIGUE 04/02/2010   Qualifier: Diagnosis of  By: Tawanna Coolerodd MD, Eugenio HoesJeffrey A    Hyperlipidemia    Hypothyroidism    Routine general medical examination at a health care facility 01/19/2016    Social History   Socioeconomic History   Marital status: Single    Spouse name: Not on file   Number of children: Not on file   Years of education: Not on file   Highest education level: Not on file  Occupational History   Not on file  Tobacco Use   Smoking status: Never   Smokeless tobacco: Never  Vaping Use   Vaping Use: Never used  Substance and Sexual Activity   Alcohol use: No    Alcohol/week: 0.0 standard drinks   Drug use: No   Sexual activity: Not on file  Other Topics Concern   Not on file  Social History Narrative   Not on file   Social Determinants of Health   Financial Resource Strain: Not on file  Food Insecurity: Not on file  Transportation Needs: Not on file  Physical Activity: Not on file  Stress: Not on file  Social Connections: Not on file  Intimate Partner Violence: Not on file    Past Surgical  History:  Procedure Laterality Date   CARDIAC SURGERY     AV canal VSD repair   HERNIA REPAIR     LUNG SURGERY     drcortication of right lung empyema    Family History  Problem Relation Age of Onset   Hyperlipidemia Mother    Hyperlipidemia Paternal Grandmother    Heart attack Paternal Grandfather     Allergies  Allergen Reactions   Codeine    Doxycycline    Penicillins     Current Outpatient Medications on File Prior to Visit  Medication Sig Dispense Refill   atorvastatin (LIPITOR) 40 MG tablet TAKE  ONE TABLET BY MOUTH DAILY AT 6 PM 90 tablet 3   atorvastatin (LIPITOR) 40 MG tablet Take by mouth.     ibuprofen (ADVIL) 800 MG tablet Take 1 tablet (800 mg total) by mouth 3 (three) times daily. 21 tablet 0   ketoconazole (NIZORAL) 2 % shampoo Apply 1 application topically 2 (two) times a week. 120 mL 5   levothyroxine (SYNTHROID) 100 MCG tablet TAKE 1 TABLET BY MOUTH DAILY BEFORE BREAKFAST 90 tablet 3   levothyroxine (SYNTHROID) 100 MCG tablet Take by mouth.     sodium chloride (MURO 128) 5 % ophthalmic solution Place 1-2 drops into the left eye every 4 (four) hours as needed for eye irritation. 15 mL 0   tobramycin-dexamethasone (TOBRADEX) ophthalmic solution Place 1 drop into the left eye 2 times daily.     No current facility-administered medications on file prior to visit.    There were no vitals taken for this visit.       Objective:   Physical Exam Vitals and nursing note reviewed.  Constitutional:      General: He is not in acute distress.    Appearance: Normal appearance. He is well-developed and normal weight.  HENT:     Head: Normocephalic and atraumatic.     Right Ear: Tympanic membrane, ear canal and external ear normal. There is no impacted cerumen.     Left Ear: Tympanic membrane, ear canal and external ear normal. There is no impacted cerumen.     Nose: Nose normal. No congestion or rhinorrhea.     Mouth/Throat:     Mouth: Mucous membranes are moist.     Pharynx: Oropharynx is clear. No oropharyngeal exudate or posterior oropharyngeal erythema.  Eyes:     General:        Right eye: No discharge.        Left eye: No discharge.     Extraocular Movements: Extraocular movements intact.     Conjunctiva/sclera: Conjunctivae normal.     Pupils: Pupils are equal, round, and reactive to light.  Neck:     Vascular: No carotid bruit.     Trachea: No tracheal deviation.  Cardiovascular:     Rate and Rhythm: Normal rate and regular rhythm.     Pulses: Normal pulses.      Heart sounds: Normal heart sounds. No murmur heard.   No friction rub. No gallop.  Pulmonary:     Effort: Pulmonary effort is normal. No respiratory distress.     Breath sounds: Normal breath sounds. No stridor. No wheezing, rhonchi or rales.  Chest:     Chest wall: No tenderness.  Abdominal:     General: Bowel sounds are normal. There is no distension.     Palpations: Abdomen is soft. There is no mass.     Tenderness: There is no abdominal tenderness. There is no right  CVA tenderness, left CVA tenderness, guarding or rebound.     Hernia: No hernia is present.  Musculoskeletal:        General: No swelling, tenderness, deformity or signs of injury. Normal range of motion.     Right lower leg: No edema.     Left lower leg: No edema.  Lymphadenopathy:     Cervical: No cervical adenopathy.  Skin:    General: Skin is warm and dry.     Capillary Refill: Capillary refill takes less than 2 seconds.     Coloration: Skin is not jaundiced or pale.     Findings: No bruising, erythema, lesion or rash.  Neurological:     General: No focal deficit present.     Mental Status: He is alert and oriented to person, place, and time.     Cranial Nerves: No cranial nerve deficit.     Sensory: No sensory deficit.     Motor: No weakness.     Coordination: Coordination normal.     Gait: Gait normal.     Deep Tendon Reflexes: Reflexes normal.  Psychiatric:        Mood and Affect: Mood normal.        Behavior: Behavior normal.        Thought Content: Thought content normal.        Judgment: Judgment normal.       Assessment & Plan:  1. Routine general medical examination at a health care facility - Continue to eat healthy and exercise - Follow up in one year or sooner if needed  - CBC with Differential/Platelet; Future - Comprehensive metabolic panel; Future - Hemoglobin A1c; Future - Lipid panel; Future - TSH; Future  2. Acquired hypothyroidism - Consider increase in synthroid  - CBC with  Differential/Platelet; Future - Comprehensive metabolic panel; Future - Hemoglobin A1c; Future - Lipid panel; Future - TSH; Future  3. Hyperlipidemia, unspecified hyperlipidemia type - Consider increase in statin  - CBC with Differential/Platelet; Future - Comprehensive metabolic panel; Future - Hemoglobin A1c; Future - Lipid panel; Future - TSH; Future  4. Atrioventricular canal type ventricular septal defect  - CBC with Differential/Platelet; Future - Comprehensive metabolic panel; Future - Hemoglobin A1c; Future - Lipid panel; Future - TSH; Future  5. DOWNS SYNDROME   6. Need for immunization against influenza  - Flu Vaccine QUAD 72mo+IM (Fluarix, Fluzone & Alfiuria Quad PF)  Shirline Frees, NP

## 2021-07-23 NOTE — Telephone Encounter (Signed)
Patient is scheduled for 02/20 at [place] for labs. Please order labs!  Please advise.

## 2021-07-23 NOTE — Telephone Encounter (Signed)
Labs placed.

## 2021-07-23 NOTE — Addendum Note (Signed)
Addended by: Waymon Amato R on: 07/23/2021 10:44 AM   Modules accepted: Orders

## 2021-07-24 ENCOUNTER — Other Ambulatory Visit: Payer: Self-pay

## 2021-07-24 ENCOUNTER — Other Ambulatory Visit: Payer: Self-pay | Admitting: Adult Health

## 2021-07-24 ENCOUNTER — Other Ambulatory Visit (INDEPENDENT_AMBULATORY_CARE_PROVIDER_SITE_OTHER): Payer: 59

## 2021-07-24 DIAGNOSIS — E785 Hyperlipidemia, unspecified: Secondary | ICD-10-CM

## 2021-07-24 DIAGNOSIS — E039 Hypothyroidism, unspecified: Secondary | ICD-10-CM

## 2021-07-24 LAB — LIPID PANEL
Cholesterol: 115 mg/dL (ref 0–200)
HDL: 31.1 mg/dL — ABNORMAL LOW (ref >39.00)
LDL Cholesterol: 53 mg/dL (ref 0–99)
NonHDL: 83.52
Total CHOL/HDL Ratio: 4
Triglycerides: 154 mg/dL — ABNORMAL HIGH (ref 0.0–149.0)
VLDL: 30.8 mg/dL (ref 0.0–40.0)

## 2021-07-24 LAB — TSH: TSH: 0.15 u[IU]/mL — ABNORMAL LOW (ref 0.35–5.50)

## 2021-07-24 MED ORDER — LEVOTHYROXINE SODIUM 88 MCG PO TABS: 88.0000 ug | ORAL_TABLET | Freq: Every day | ORAL | 3 refills | Status: DC

## 2021-10-31 ENCOUNTER — Encounter: Payer: Self-pay | Admitting: Adult Health

## 2021-10-31 DIAGNOSIS — E785 Hyperlipidemia, unspecified: Secondary | ICD-10-CM

## 2021-11-01 MED ORDER — LEVOTHYROXINE SODIUM 88 MCG PO TABS: 88.0000 ug | ORAL_TABLET | Freq: Every day | ORAL | 3 refills | Status: DC

## 2021-11-01 MED ORDER — ATORVASTATIN CALCIUM 40 MG PO TABS: ORAL_TABLET | ORAL | 3 refills | Status: DC

## 2021-11-01 NOTE — Telephone Encounter (Signed)
Spoke to [place] and advised that Rx has been refilled. Lorella Nimrod stated that agency that he works for is requesting proof of medication as well has sig. I advised that this information is in [employer]. I also informed Lorella Nimrod that if he is unable to get it that way I would print it for him. Lorella Nimrod stated that he will look and try to do it.

## 2021-11-14 ENCOUNTER — Encounter: Payer: Self-pay | Admitting: Adult Health

## 2021-11-14 NOTE — Telephone Encounter (Signed)
Called pt caregiver and he stated he will get the number for the agency and he will send it to pt mychart.

## 2021-11-16 NOTE — Telephone Encounter (Signed)
PPW has been faxed  

## 2021-11-20 NOTE — Telephone Encounter (Signed)
Mervin Kung to see if Noreene Larsson still needed med list. Per Ramond Marrow has not said anything. Lorella Nimrod will call to see if it was received tomorrow.

## 2021-11-20 NOTE — Telephone Encounter (Signed)
Lm for Noreene Larsson to see if she received the fax.

## 2021-11-21 NOTE — Telephone Encounter (Signed)
Grant [name] returned call and stated that she received the medlist. Spoke to [place] to advise of update. Grant [name] was aware. No further actions needed.

## 2022-04-24 ENCOUNTER — Encounter: Payer: Self-pay | Admitting: Adult Health

## 2022-04-24 NOTE — Telephone Encounter (Signed)
Pt caregiver advised that refills are at the pharmacy

## 2022-07-18 ENCOUNTER — Encounter: Payer: Self-pay | Admitting: Adult Health

## 2022-07-18 NOTE — Telephone Encounter (Signed)
Please advise 

## 2022-07-23 ENCOUNTER — Other Ambulatory Visit: Payer: Self-pay | Admitting: Adult Health

## 2022-07-23 MED ORDER — AMOXICILLIN 500 MG PO CAPS: 2000.0000 mg | ORAL_CAPSULE | Freq: Once | ORAL | 0 refills | Status: AC

## 2022-08-06 ENCOUNTER — Other Ambulatory Visit: Payer: Self-pay

## 2022-08-30 ENCOUNTER — Encounter: Payer: Self-pay | Admitting: Adult Health

## 2022-09-03 NOTE — Telephone Encounter (Signed)
Please advise 

## 2022-11-05 ENCOUNTER — Encounter: Payer: Self-pay | Admitting: Adult Health

## 2022-11-05 ENCOUNTER — Other Ambulatory Visit: Payer: Self-pay | Admitting: Adult Health

## 2022-11-05 DIAGNOSIS — E785 Hyperlipidemia, unspecified: Secondary | ICD-10-CM

## 2022-11-05 MED ORDER — ATORVASTATIN CALCIUM 40 MG PO TABS: ORAL_TABLET | ORAL | 0 refills | Status: DC

## 2022-12-20 ENCOUNTER — Other Ambulatory Visit: Payer: Self-pay | Admitting: Adult Health

## 2022-12-20 NOTE — Telephone Encounter (Signed)
Pt's Father Darrie Macmillan, Sr.) called to say this Pt is out of the state in [place] and has run out of this medication.  Pt's last office visit was on 06/20/2021. Pt was offered an OV, and Father said he cannot come in for a visit because he is in [place] and  he does not know when he will be coming back. Father advise NP is OOO today, but will probably need Pt to schedule an OV.  Father is asking for a call back at [phone] Please advise.

## 2022-12-25 NOTE — Telephone Encounter (Signed)
Called pt caregiver to schedule pt a CPE for further refills.

## 2022-12-26 ENCOUNTER — Telehealth: Payer: Self-pay | Admitting: Adult Health

## 2022-12-26 MED ORDER — LEVOTHYROXINE SODIUM 88 MCG PO TABS: 88.0000 ug | ORAL_TABLET | Freq: Every day | ORAL | 3 refills | Status: DC

## 2022-12-26 MED ORDER — LEVOTHYROXINE SODIUM 88 MCG PO TABS: 88.0000 ug | ORAL_TABLET | Freq: Every day | ORAL | 0 refills | Status: DC

## 2022-12-26 NOTE — Addendum Note (Signed)
Addended by: Waymon Amato R on: 12/26/2022 04:15 PM   Modules accepted: Orders

## 2022-12-26 NOTE — Telephone Encounter (Signed)
Mother requesting a call with an update

## 2022-12-26 NOTE — Telephone Encounter (Signed)
Pharmacy not coming up in database. Tried to call pt mother to advise but no answer.

## 2022-12-26 NOTE — Telephone Encounter (Signed)
Rx refilled.

## 2022-12-26 NOTE — Telephone Encounter (Signed)
Disabled patient's mother requesting a refill of levothyroxine (SYNTHROID) 88 MCG tablet patient is out of state visiting family and would like the prescription to be sent to [facility] [address] [place] [address]  [phone]. Will schedule appt for when patient returns

## 2023-01-01 ENCOUNTER — Encounter (INDEPENDENT_AMBULATORY_CARE_PROVIDER_SITE_OTHER): Payer: Self-pay

## 2023-01-23 ENCOUNTER — Encounter: Payer: Self-pay | Admitting: Adult Health

## 2023-01-24 NOTE — Telephone Encounter (Signed)
Please advise 

## 2023-01-30 ENCOUNTER — Ambulatory Visit (INDEPENDENT_AMBULATORY_CARE_PROVIDER_SITE_OTHER): Payer: Managed Care, Other (non HMO) | Admitting: Adult Health

## 2023-01-30 ENCOUNTER — Encounter: Payer: Self-pay | Admitting: Adult Health

## 2023-01-30 ENCOUNTER — Other Ambulatory Visit: Payer: Self-pay | Admitting: Adult Health

## 2023-01-30 VITALS — BP 100/62 | HR 80 | Temp 98.0°F | Ht 60.24 in | Wt 149.0 lb

## 2023-01-30 DIAGNOSIS — Q212 Atrioventricular septal defect, unspecified as to partial or complete: Secondary | ICD-10-CM

## 2023-01-30 DIAGNOSIS — Z0000 Encounter for general adult medical examination without abnormal findings: Secondary | ICD-10-CM

## 2023-01-30 DIAGNOSIS — E669 Obesity, unspecified: Secondary | ICD-10-CM | POA: Diagnosis not present

## 2023-01-30 DIAGNOSIS — E785 Hyperlipidemia, unspecified: Secondary | ICD-10-CM | POA: Diagnosis not present

## 2023-01-30 DIAGNOSIS — E039 Hypothyroidism, unspecified: Secondary | ICD-10-CM

## 2023-01-30 DIAGNOSIS — Q909 Down syndrome, unspecified: Secondary | ICD-10-CM

## 2023-01-30 LAB — CBC
HCT: 43.4 % (ref 39.0–52.0)
Hemoglobin: 14.5 g/dL (ref 13.0–17.0)
MCHC: 33.4 g/dL (ref 30.0–36.0)
MCV: 97.8 fl (ref 78.0–100.0)
Platelets: 254 10*3/uL (ref 150.0–400.0)
RBC: 4.44 Mil/uL (ref 4.22–5.81)
RDW: 14.1 % (ref 11.5–15.5)
WBC: 5.6 10*3/uL (ref 4.0–10.5)

## 2023-01-30 LAB — LIPID PANEL
Cholesterol: 184 mg/dL (ref 0–200)
HDL: 44.7 mg/dL (ref >39.00)
NonHDL: 139.38
Total CHOL/HDL Ratio: 4
Triglycerides: 263 mg/dL — ABNORMAL HIGH (ref 0.0–149.0)
VLDL: 52.6 mg/dL — ABNORMAL HIGH (ref 0.0–40.0)

## 2023-01-30 LAB — COMPREHENSIVE METABOLIC PANEL
ALT: 124 U/L — ABNORMAL HIGH (ref 0–53)
AST: 63 U/L — ABNORMAL HIGH (ref 0–37)
Albumin: 4.2 g/dL (ref 3.5–5.2)
Alkaline Phosphatase: 51 U/L (ref 39–117)
BUN: 24 mg/dL — ABNORMAL HIGH (ref 6–23)
CO2: 30 mEq/L (ref 19–32)
Calcium: 9.6 mg/dL (ref 8.4–10.5)
Chloride: 104 mEq/L (ref 96–112)
Creatinine, Ser: 1.18 mg/dL (ref 0.40–1.50)
GFR: 82.11 mL/min (ref >60.00)
Glucose, Bld: 85 mg/dL (ref 70–99)
Potassium: 4.5 mEq/L (ref 3.5–5.1)
Sodium: 142 mEq/L (ref 135–145)
Total Bilirubin: 0.8 mg/dL (ref 0.2–1.2)
Total Protein: 7.4 g/dL (ref 6.0–8.3)

## 2023-01-30 LAB — LDL CHOLESTEROL, DIRECT: Direct LDL: 122 mg/dL

## 2023-01-30 LAB — TSH: TSH: 3.4 u[IU]/mL (ref 0.35–5.50)

## 2023-01-30 LAB — HEMOGLOBIN A1C: Hgb A1c MFr Bld: 5.1 % (ref 4.6–6.5)

## 2023-01-30 NOTE — Patient Instructions (Addendum)
It was great seeing you today   We will follow up with you regarding your lab work   Please let me know if you need anything   

## 2023-01-30 NOTE — Progress Notes (Addendum)
Subjective:    Patient ID: Grant [name], male    DOB: [birth year], 32 y.o.   MRN: [id number]  HPI Patient presents for yearly preventative medicine examination. 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  He presents to the office today with her legal guardian and his father due to history of Down syndrome.  He has not been seen in the office for the last year and a half.   H/o Hypothyroidism-takes Synthroid 100 mcg daily. Lab Results  Component Value Date   TSH 0.15 (L) 07/24/2021   Hyperlipidemia-takes Lipitor 40 mg daily.  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  He had corrective surgery at 4-1/2 months at the [facility] and was previously seen at [facility], he was last seen by cardiology in 2018.  Denies activity intolerance, chest pain, shortness of breath  Obesity - he has gained 19 pounds or so, family reports that this has been over the last month when living with his mom. His mom would like his Leptin level checked - per father.   All immunizations and health maintenance protocols were reviewed with the patient and needed orders were placed.  Appropriate screening laboratory values were ordered for the patient including screening of hyperlipidemia, renal function and hepatic function.  Medication reconciliation,  past medical history, social history, problem list and allergies were reviewed in detail with the  patient  Goals were established with regard to weight loss, exercise, and  diet in compliance with medications. He has gained about 19 pounds over the last year. His guardian reports that he has been going back and forth between his house and his mom's. When he is at his mom's house he does not eat as healthy nor does he exercise Wt Readings from Last 3 Encounters:  01/30/23 149 lb (67.6 kg)  06/20/21 128 lb (58.1 kg)  11/03/20 127 lb (57.6 kg)    Review of Systems  Constitutional: Negative.   HENT: Negative.    Eyes: Negative.   Respiratory: Negative.    Cardiovascular: Negative.   Gastrointestinal: Negative.   Endocrine: Negative.   Genitourinary: Negative.   Musculoskeletal: Negative.   Skin: Negative.   Allergic/Immunologic: Negative.   Neurological:  Positive for speech difficulty.       Downs syndrome   Hematological: Negative.   Psychiatric/Behavioral: Negative.    All other systems reviewed and are negative.  Past Medical History:  Diagnosis Date   Carbuncle and furuncle of unspecified site 08/07/2009   Qualifier: Diagnosis of  By: Tawanna Cooler MD, Tinnie Gens A    Congenital anomaly of heart 04/01/2008   Qualifier: Diagnosis of  By: Tawanna Cooler MD, Eugenio Hoes    Down's syndrome    FATIGUE 04/02/2010   Qualifier: Diagnosis of  By: Tawanna Cooler MD, Eugenio Hoes    Hyperlipidemia    Hypothyroidism    Routine general medical examination at a health care facility 01/19/2016    Social History   Socioeconomic History   Marital status: Single  Spouse name: Not on file   Number of children: Not on file   Years of education: Not on file   Highest education level: Not on file  Occupational History   Not on file  Tobacco Use   Smoking status: Never   Smokeless tobacco: Never  Vaping Use   Vaping status: Never Used  Substance and Sexual Activity   Alcohol use: No    Alcohol/week: 0.0 standard drinks of alcohol   Drug use: No   Sexual activity: Not on file  Other Topics Concern   Not on file   Social History Narrative   Not on file   Social Determinants of Health   Financial Resource Strain: Not on file  Food Insecurity: Not on file  Transportation Needs: Not on file  Physical Activity: Not on file  Stress: Not on file  Social Connections: Not on file  Intimate Partner Violence: Not on file    Past Surgical History:  Procedure Laterality Date   CARDIAC SURGERY     AV canal VSD repair   HERNIA REPAIR     LUNG SURGERY     drcortication of right lung empyema    Family History  Problem Relation Age of Onset   Hyperlipidemia Mother    Hyperlipidemia Paternal Grandmother    Heart attack Paternal Grandfather     Allergies  Allergen Reactions   Codeine    Doxycycline    Penicillins     Current Outpatient Medications on File Prior to Visit  Medication Sig Dispense Refill   atorvastatin (LIPITOR) 40 MG tablet TAKE ONE TABLET BY MOUTH DAILY AT 6 PM 90 tablet 0   ketoconazole (NIZORAL) 2 % shampoo Apply 1 application topically 2 (two) times a week. 120 mL 5   levothyroxine (SYNTHROID) 88 MCG tablet Take 1 tablet (88 mcg total) by mouth daily. 90 tablet 0   ibuprofen (ADVIL) 800 MG tablet Take 1 tablet (800 mg total) by mouth 3 (three) times daily. 21 tablet 0   sodium chloride (MURO 128) 5 % ophthalmic ointment Place 1 drop into the left eye 2 times daily.     sodium chloride (MURO 128) 5 % ophthalmic solution Place 1 drop into the left eye daily.     No current facility-administered medications on file prior to visit.    BP 100/62   Pulse 80   Temp 98 F (36.7 C) (Oral)   Ht 5' 0.24" (1.53 m)   Wt 149 lb (67.6 kg)   SpO2 98%   BMI 28.87 kg/m       Objective:   Physical Exam Vitals and nursing note reviewed.  Constitutional:      General: He is not in acute distress.    Appearance: Normal appearance. He is obese. He is not ill-appearing.  HENT:     Head: Normocephalic and atraumatic.     Right Ear: Tympanic membrane, ear canal and external ear  normal. There is no impacted cerumen.     Left Ear: Tympanic membrane, ear canal and external ear normal. There is no impacted cerumen.     Nose: Nose normal. No congestion or rhinorrhea.     Mouth/Throat:     Mouth: Mucous membranes are moist.     Pharynx: Oropharynx is clear.  Eyes:     Extraocular Movements: Extraocular movements intact.     Conjunctiva/sclera: Conjunctivae normal.     Pupils: Pupils are equal, round, and reactive to light.  Neck:     Vascular: No  carotid bruit.  Cardiovascular:     Rate and Rhythm: Normal rate and regular rhythm.     Pulses: Normal pulses.     Heart sounds: No murmur heard.    No friction rub. No gallop.  Pulmonary:     Effort: Pulmonary effort is normal.     Breath sounds: Normal breath sounds.  Abdominal:     General: Abdomen is flat. Bowel sounds are normal. There is no distension.     Palpations: Abdomen is soft. There is no mass.     Tenderness: There is no abdominal tenderness. There is no guarding or rebound.     Hernia: No hernia is present.  Musculoskeletal:        General: Normal range of motion.     Cervical back: Normal range of motion and neck supple.  Lymphadenopathy:     Cervical: No cervical adenopathy.  Skin:    General: Skin is warm and dry.     Capillary Refill: Capillary refill takes less than 2 seconds.  Neurological:     Mental Status: He is alert. Mental status is at baseline.     Comments: Developmental delay  Psychiatric:        Mood and Affect: Mood normal.        Behavior: Behavior normal.        Thought Content: Thought content normal.        Judgment: Judgment normal.       Assessment & Plan:  1. Routine general medical examination at a health care facility Today patient counseled on age appropriate routine health concerns for screening and prevention, each reviewed and up to date or declined. Immunizations reviewed and up to date or declined. Labs ordered and reviewed. Risk factors for depression  reviewed and negative. Hearing function and visual acuity are intact. ADLs screened and addressed as needed. Functional ability and level of safety reviewed and appropriate. Education, counseling and referrals performed based on assessed risks today. Patient provided with a copy of personalized plan for preventive services. - Follow up in one year  - Work on weight reduction through lifestyle modifications   2. Hyperlipidemia, unspecified hyperlipidemia type - Consider increase in statin doseage - Eat a heart healthy diet and exercise - Lipid panel; Future - TSH; Future - CBC; Future - Comprehensive metabolic panel; Future - Comprehensive metabolic panel - CBC - TSH - Lipid panel  3. Acquired hypothyroidism - Consider dose change of synthroid  - Lipid panel; Future - TSH; Future - CBC; Future - Comprehensive metabolic panel; Future - Comprehensive metabolic panel - CBC - TSH - Lipid panel  4. Atrioventricular canal type ventricular septal defect  - Lipid panel; Future - TSH; Future - CBC; Future - Comprehensive metabolic panel; Future - Comprehensive metabolic panel - CBC - TSH - Lipid panel  5. DOWNS SYNDROME - Developmental delay. He is unable to communicate or make important decisions for himself without a caregiver.   6. Class 1 obesity -Now that he is living back in [place] he will be eating healthier and exercising more frequently. There is no concern on my behalf that he will not be able to get back to a healthy weight.  - Leptin, Serum; Future - Lipid panel; Future - TSH; Future - CBC; Future - Comprehensive metabolic panel; Future - Hemoglobin A1c; Future - Hemoglobin A1c - Comprehensive metabolic panel - CBC - TSH - Lipid panel - Leptin, Serum  Shirline Frees, NP

## 2023-01-31 MED ORDER — LEVOTHYROXINE SODIUM 88 MCG PO TABS: 88.0000 ug | ORAL_TABLET | Freq: Every day | ORAL | 0 refills | Status: DC

## 2023-01-31 MED ORDER — ATORVASTATIN CALCIUM 40 MG PO TABS: ORAL_TABLET | ORAL | 0 refills | Status: DC

## 2023-02-01 LAB — LEPTIN, SERUM: Leptin, Serum: 22.7 ng/mL

## 2023-02-21 ENCOUNTER — Encounter: Payer: Self-pay | Admitting: Adult Health

## 2023-02-21 DIAGNOSIS — E669 Obesity, unspecified: Secondary | ICD-10-CM

## 2023-02-21 NOTE — Telephone Encounter (Signed)
Please advise 

## 2023-02-23 ENCOUNTER — Encounter: Payer: Self-pay | Admitting: Adult Health

## 2023-02-26 NOTE — Telephone Encounter (Signed)
Please advise 

## 2023-02-28 ENCOUNTER — Ambulatory Visit: Payer: Managed Care, Other (non HMO) | Attending: Cardiology | Admitting: Cardiology

## 2023-02-28 ENCOUNTER — Encounter: Payer: Self-pay | Admitting: Cardiology

## 2023-02-28 VITALS — BP 116/80 | HR 62 | Ht 71.0 in | Wt 141.0 lb

## 2023-02-28 DIAGNOSIS — E782 Mixed hyperlipidemia: Secondary | ICD-10-CM | POA: Diagnosis not present

## 2023-02-28 DIAGNOSIS — Q909 Down syndrome, unspecified: Secondary | ICD-10-CM

## 2023-02-28 DIAGNOSIS — Q212 Atrioventricular septal defect, unspecified as to partial or complete: Secondary | ICD-10-CM

## 2023-02-28 NOTE — Patient Instructions (Signed)
Medication Instructions:  Your physician recommends that you continue on your current medications as directed. Please refer to the Current Medication list given to you today.  *If you need a refill on your cardiac medications before your next appointment, please call your pharmacy*   Lab Work: None If you have labs (blood work) drawn today and your tests are completely normal, you will receive your results only by: [place] (if you have MyChart) OR A paper copy in the mail If you have any lab test that is abnormal or we need to change your treatment, we will call you to review the results.   Testing/Procedures: Your physician has requested that you have an echocardiogram. Echocardiography is a painless test that uses sound waves to create images of your heart. It provides your doctor with information about the size and shape of your heart and how well your heart's chambers and valves are working. This procedure takes approximately one hour. There are no restrictions for this procedure. Please do NOT wear cologne, perfume, aftershave, or lotions (deodorant is allowed). Please arrive 15 minutes prior to your appointment time.    Follow-Up: At [facility], you and your health needs are our priority.  As part of our continuing mission to provide you with exceptional heart care, we have created designated Provider Care Teams.  These Care Teams include your primary Cardiologist (physician) and Advanced Practice Providers (APPs -  Physician Assistants and Nurse Practitioners) who all work together to provide you with the care you need, when you need it.  We recommend signing up for the patient portal called "MyChart".  Sign up information is provided on this After Visit Summary.  MyChart is used to connect with patients for Virtual Visits (Telemedicine).  Patients are able to view lab/test results, encounter notes, upcoming appointments, etc.  Non-urgent messages can be sent to your  provider as well.   To learn more about what you can do with MyChart, go to [email or web address].    Your next appointment:   1 year(s)  Provider:   Shirlee More, MD    Other Instructions None

## 2023-04-09 ENCOUNTER — Ambulatory Visit: Payer: Managed Care, Other (non HMO) | Attending: Cardiology

## 2023-04-09 DIAGNOSIS — E782 Mixed hyperlipidemia: Secondary | ICD-10-CM | POA: Diagnosis not present

## 2023-04-09 DIAGNOSIS — Q909 Down syndrome, unspecified: Secondary | ICD-10-CM

## 2023-04-09 DIAGNOSIS — Q212 Atrioventricular septal defect, unspecified as to partial or complete: Secondary | ICD-10-CM | POA: Diagnosis not present

## 2023-04-09 LAB — ECHOCARDIOGRAM COMPLETE
Area-P 1/2: 3.65 cm2
S' Lateral: 2.8 cm

## 2023-04-15 ENCOUNTER — Encounter: Payer: Self-pay | Admitting: Adult Health

## 2023-04-15 NOTE — Telephone Encounter (Signed)
Please advise on the video

## 2023-04-16 ENCOUNTER — Encounter: Payer: Self-pay | Admitting: Adult Health

## 2023-04-16 ENCOUNTER — Ambulatory Visit (INDEPENDENT_AMBULATORY_CARE_PROVIDER_SITE_OTHER): Payer: Managed Care, Other (non HMO) | Admitting: Adult Health

## 2023-04-16 VITALS — BP 110/82 | HR 79 | Temp 97.8°F | Ht 59.75 in | Wt 137.0 lb

## 2023-04-16 DIAGNOSIS — R748 Abnormal levels of other serum enzymes: Secondary | ICD-10-CM | POA: Diagnosis not present

## 2023-04-16 DIAGNOSIS — E785 Hyperlipidemia, unspecified: Secondary | ICD-10-CM | POA: Diagnosis not present

## 2023-04-16 DIAGNOSIS — Z23 Encounter for immunization: Secondary | ICD-10-CM

## 2023-04-16 DIAGNOSIS — E66811 Obesity, class 1: Secondary | ICD-10-CM

## 2023-04-16 LAB — COMPREHENSIVE METABOLIC PANEL
ALT: 12 U/L (ref 0–53)
AST: 21 U/L (ref 0–37)
Albumin: 4.4 g/dL (ref 3.5–5.2)
Alkaline Phosphatase: 63 U/L (ref 39–117)
BUN: 22 mg/dL (ref 6–23)
CO2: 29 meq/L (ref 19–32)
Calcium: 9.5 mg/dL (ref 8.4–10.5)
Chloride: 104 meq/L (ref 96–112)
Creatinine, Ser: 1.27 mg/dL (ref 0.40–1.50)
GFR: 75.07 mL/min (ref >60.00)
Glucose, Bld: 119 mg/dL — ABNORMAL HIGH (ref 70–99)
Potassium: 3.9 meq/L (ref 3.5–5.1)
Sodium: 139 meq/L (ref 135–145)
Total Bilirubin: 0.9 mg/dL (ref 0.2–1.2)
Total Protein: 7.6 g/dL (ref 6.0–8.3)

## 2023-04-16 LAB — LIPID PANEL
Cholesterol: 129 mg/dL (ref 0–200)
HDL: 34.6 mg/dL — ABNORMAL LOW (ref >39.00)
LDL Cholesterol: 72 mg/dL (ref 0–99)
NonHDL: 94.32
Total CHOL/HDL Ratio: 4
Triglycerides: 114 mg/dL (ref 0.0–149.0)
VLDL: 22.8 mg/dL (ref 0.0–40.0)

## 2023-04-16 NOTE — Progress Notes (Signed)
Subjective:    Patient ID: Grant [name], male    DOB: [birth year], 32 y.o.   MRN: [id number]  HPI 32 year old male who is following up today regarding obesity and hyperlipidemia.  During his physical exam 3 months ago he had gained 19 pounds due to being off of his diet and not exercising.  Labs showed that his triglycerides and LDL cholesterol have increased and his liver enzymes were also elevated.  His caregivers were advised to get Dorinda Hill back on the diet and exercising in order to lose weight and hopefully help his levels decrease.  He was taking Lipitor and was continued at 40 mg.  He is back to eating healthy and exercising. He has been able to lose about 12 pounds over the last three months.   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   Qualifier: Diagnosis of  By: Tawanna Cooler MD, Eugenio Hoes    Down's syndrome    FATIGUE 04/02/2010   Qualifier: Diagnosis of  By: Tawanna Cooler MD, Eugenio Hoes    Hyperlipidemia    Hypothyroidism    Routine general medical examination at a health care facility 01/19/2016    Social History   Socioeconomic History   Marital status: Single    Spouse name: Not on file   Number of children: Not on file   Years of education: Not on file   Highest education level: Not on file  Occupational History   Not on file  Tobacco Use   Smoking status: Never   Smokeless tobacco: Never  Vaping Use   Vaping status: Never Used  Substance and Sexual Activity   Alcohol use: No    Alcohol/week: 0.0 standard drinks of alcohol   Drug use: No   Sexual activity: Not on file  Other Topics  Concern   Not on file  Social History Narrative   Not on file   Social Determinants of Health   Financial Resource Strain: Not on file  Food Insecurity: Not on file  Transportation Needs: Not on file  Physical Activity: Not on file  Stress: Not on file  Social Connections: Not on file  Intimate Partner Violence: Not on file    Past Surgical History:  Procedure Laterality Date   CARDIAC SURGERY     AV canal VSD repair   HERNIA REPAIR     LUNG SURGERY     drcortication of right lung empyema    Family History  Problem Relation Age of Onset   Hyperlipidemia Mother    Hyperlipidemia Paternal Grandmother    Heart attack Paternal Grandfather     Allergies  Allergen Reactions   Codeine    Doxycycline    Penicillins     Current Outpatient Medications on File Prior to Visit  Medication Sig Dispense Refill   atorvastatin (LIPITOR) 40 MG tablet TAKE ONE TABLET BY MOUTH DAILY AT 6 PM 90 tablet 0   ketoconazole (NIZORAL) 2 % shampoo Apply 1 application topically 2 (two) times a week. 120 mL 5   levothyroxine (SYNTHROID) 88 MCG  tablet Take 1 tablet (88 mcg total) by mouth daily. 90 tablet 0   No current facility-administered medications on file prior to visit.    BP 110/82   Pulse 79   Temp 97.8 F (36.6 C) (Oral)   Ht 4' 11.75" (1.518 m)   Wt 137 lb (62.1 kg)   SpO2 98%   BMI 26.98 kg/m       Objective:   Physical Exam Vitals and nursing note reviewed.  Constitutional:      Appearance: Normal appearance.  Cardiovascular:     Rate and Rhythm: Normal rate and regular rhythm.     Pulses: Normal pulses.     Heart sounds: Normal heart sounds.  Pulmonary:     Effort: Pulmonary effort is normal.     Breath sounds: Normal breath sounds.  Musculoskeletal:        General: Normal range of motion.  Skin:    General: Skin is warm and dry.  Neurological:     General: No focal deficit present.     Mental Status: He is alert and oriented to person, place, and time.   Psychiatric:        Mood and Affect: Mood normal.        Behavior: Behavior normal.        Thought Content: Thought content normal.        Judgment: Judgment normal.       Assessment & Plan:  1. Hyperlipidemia, unspecified hyperlipidemia type  - Comprehensive metabolic panel; Future - Lipid panel; Future  2. Class 1 obesity - Continue to eat healthy and exercise - Comprehensive metabolic panel; Future - Lipid panel; Future  3. Elevated liver enzymes  - Comprehensive metabolic panel; Future - Lipid panel; Future  Shirline Frees, NP

## 2023-04-17 NOTE — Addendum Note (Signed)
Addended by: Waymon Amato R on: 04/17/2023 09:04 AM   Modules accepted: Orders

## 2023-04-24 ENCOUNTER — Ambulatory Visit: Payer: Managed Care, Other (non HMO) | Admitting: Dietician

## 2023-05-21 ENCOUNTER — Other Ambulatory Visit: Payer: Self-pay | Admitting: Adult Health

## 2023-06-09 ENCOUNTER — Encounter: Payer: Self-pay | Admitting: Adult Health

## 2023-06-10 NOTE — Telephone Encounter (Signed)
**Note De-identified  [place] Obfuscation** Please advise 

## 2023-06-11 ENCOUNTER — Other Ambulatory Visit: Payer: Self-pay | Admitting: Adult Health

## 2023-06-11 DIAGNOSIS — R7309 Other abnormal glucose: Secondary | ICD-10-CM

## 2023-06-27 ENCOUNTER — Other Ambulatory Visit (INDEPENDENT_AMBULATORY_CARE_PROVIDER_SITE_OTHER): Payer: Managed Care, Other (non HMO)

## 2023-06-27 ENCOUNTER — Ambulatory Visit: Payer: Managed Care, Other (non HMO) | Admitting: Dietician

## 2023-06-27 ENCOUNTER — Encounter: Payer: Self-pay | Admitting: Adult Health

## 2023-06-27 DIAGNOSIS — R7309 Other abnormal glucose: Secondary | ICD-10-CM

## 2023-06-27 LAB — BASIC METABOLIC PANEL
BUN: 20 mg/dL (ref 6–23)
CO2: 30 meq/L (ref 19–32)
Calcium: 9.1 mg/dL (ref 8.4–10.5)
Chloride: 103 meq/L (ref 96–112)
Creatinine, Ser: 1.26 mg/dL (ref 0.40–1.50)
GFR: 75.68 mL/min (ref >60.00)
Glucose, Bld: 88 mg/dL (ref 70–99)
Potassium: 4.1 meq/L (ref 3.5–5.1)
Sodium: 139 meq/L (ref 135–145)

## 2023-07-16 ENCOUNTER — Ambulatory Visit: Payer: Managed Care, Other (non HMO) | Admitting: Dietician

## 2023-07-21 ENCOUNTER — Encounter: Payer: Self-pay | Admitting: Adult Health

## 2023-07-22 ENCOUNTER — Other Ambulatory Visit: Payer: Self-pay | Admitting: Adult Health

## 2023-07-22 MED ORDER — PREDNISONE 10 MG PO TABS: 10.0000 mg | ORAL_TABLET | Freq: Every day | ORAL | 0 refills | Status: DC

## 2023-07-22 NOTE — Telephone Encounter (Signed)
 Okay to fill?

## 2023-07-30 ENCOUNTER — Other Ambulatory Visit: Payer: Self-pay | Admitting: Adult Health

## 2023-07-30 DIAGNOSIS — E785 Hyperlipidemia, unspecified: Secondary | ICD-10-CM

## 2023-07-31 MED ORDER — ATORVASTATIN CALCIUM 40 MG PO TABS
ORAL_TABLET | ORAL | 0 refills | Status: DC
Start: 2023-07-31 — End: 2024-02-17

## 2023-08-22 ENCOUNTER — Other Ambulatory Visit: Payer: Self-pay | Admitting: Adult Health

## 2023-08-27 ENCOUNTER — Encounter: Payer: Managed Care, Other (non HMO) | Attending: Adult Health | Admitting: Dietician

## 2023-08-27 VITALS — Wt 139.7 lb

## 2023-08-27 DIAGNOSIS — E66811 Obesity, class 1: Secondary | ICD-10-CM | POA: Diagnosis present

## 2023-08-27 NOTE — Progress Notes (Signed)
 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   Qualifier: Diagnosis of  By: Tawanna Cooler MD, Eugenio Hoes    Down's syndrome    FATIGUE 04/02/2010   Qualifier: Diagnosis of  By: Tawanna Cooler MD, Eugenio Hoes    Hyperlipidemia    Hypothyroidism    Routine general medical examination at a health care facility 01/19/2016    Medications:  Current Outpatient Medications:    atorvastatin (LIPITOR) 40 MG tablet, TAKE ONE TABLET BY MOUTH DAILY AT 6 PM, Disp: 90 tablet, Rfl: 0   levothyroxine (SYNTHROID) 88 MCG tablet, TAKE 1 TABLET BY MOUTH DAILY, Disp: 90 tablet, Rfl: 0   ketoconazole (NIZORAL) 2 % shampoo, Apply 1 application topically 2 (two) times a week., Disp: 120 mL, Rfl: 5  Labs:  Lab Results  Component Value Date   HGBA1C 5.1 01/30/2023   Lab Results  Component Value Date   CHOL 129 04/16/2023   HDL 34.60 (L) 04/16/2023   LDLCALC 72 04/16/2023   LDLDIRECT 122.0 01/30/2023   TRIG 114.0 04/16/2023   CHOLHDL 4 04/16/2023   Notable Signs/Symptoms: glucose 88 mg/dL per BMP obtained 9/56/2130 Wt Readings from Last 3 Encounters:  08/27/23 139 lb 11.2 oz (63.4 kg)  04/16/23 137 lb (62.1 kg)  02/28/23 141 lb (64 kg)   BMI Readings from Last 3 Encounters:  08/27/23 27.51 kg/m  04/16/23 26.98 kg/m  02/28/23 19.67 kg/m   Lifestyle & Dietary Hx Pt presents today with caregiver, Lorella Nimrod. Pt's Caregvier reports Pt is non verbal. Pt's Caregiver reports a desire to ensure Pt is getting adequate nutrition and review recent labs for concerns. Pt's Caregiver reports Pt  lives with caregiver and caregivers wife who does the shopping and cooking. Pt does purchase foods when out with friends and family. Pt's caregiver reports Pt eats out about 5-6 times weekly. Caregiver reports Pt is not a picky eater and will eat whatever is provided to him. Pt visits with mother and father for a few nights or more. All Pt's caregiver's questions were answered during this encounter.    Estimated daily fluid intake: 80+ oz Supplements: none Sleep: good interrupted at times Stress / self-care: 0 out of 10 /self care includes laughing, games, music Current average weekly physical activity: 3-4 miles on 3 days weekly, golf   24-Hr Dietary Recall First Meal: chic fa la or first watch or banana, water with orange mio fit  Snack: fruit  two banana or oranges or bar Second Meal: [employer] or chic fa la or Kava or grilled chicken on a bed of salad with dressing or peanut butter and jelly sandwich, cereal with yogurt,  Snack: fruit Third Meal: chic fa la or Kava or chicken or steak quesdilla or rice black beans or ground pork beef, rice, zucchini, carrots or hello fresh meatballs, rice, aspargus or green beans Snack: fruit Beverages: water, water with mio fit    NUTRITION DIAGNOSIS  NB-1.1 Food and nutrition-related knowledge deficit As related to limited prior nutrition related education .  As evidenced by Pt's caregiver reports, dietary  recall and recent labs reviewed.   NUTRITION INTERVENTION  Nutrition education (E-1) on the following topics:  Fruits & Vegetables: Aim to fill half your plate with a variety of fruits and vegetables. They are rich in vitamins, minerals, and fiber, and can help reduce the risk of chronic diseases. Choose a colorful assortment of fruits and vegetables to ensure you get a wide range of nutrients. Grains and Starches: Make at least half of your grain choices whole grains, such as brown rice, whole wheat bread, and oats. Whole grains provide fiber,  which aids in digestion and healthy cholesterol levels. Aim for whole forms of starchy vegetables such as potatoes, sweet potatoes, beans, peas, and corn, which are fiber rich and provide many vitamins and minerals.  Protein: Incorporate lean sources of protein, such as poultry, fish, beans, nuts, and seeds, into your meals. Protein is essential for building and repairing tissues, staying full, balancing blood sugar, as well as supporting immune function. Dairy: Include low-fat or fat-free dairy products like milk, yogurt, and cheese in your diet. Dairy foods are excellent sources of calcium and vitamin D, which are crucial for bone health.  Physical Activity: Aim for 30-60 minutes of physical activity daily. Regular physical activity promotes overall health-including helping to reduce risk for heart disease and diabetes, promoting mental health, and helping [place] sleep better.    Handouts Provided Include  Plate planner  Snack List   Learning Style & Readiness for Change Teaching method utilized: Visual & Auditory  Demonstrated degree of understanding via: Teach Back  Barriers to learning/adherence to lifestyle change: requires guardian  Goals Established by Pt 1- Aim for balanced meals and snacks daily 9inch plate with 1/2 non starchy vegetables, 1/4 protein, 1/4 starch 2-Continue with physical activity aiming for daily   MONITORING & EVALUATION Dietary intake, weekly physical activity  Next Steps  Patient is to return PRN.

## 2023-08-27 NOTE — Patient Instructions (Signed)
 1- Aim for balanced meals and snacks daily 9inch plate with 1/2 non starchy vegetables, 1/4 protein, 1/4 starch 2-Continue with physical activity aiming for daily

## 2023-09-23 ENCOUNTER — Other Ambulatory Visit: Payer: Self-pay | Admitting: Adult Health

## 2023-09-23 MED ORDER — LEVOTHYROXINE SODIUM 88 MCG PO TABS: 88.0000 ug | ORAL_TABLET | Freq: Every day | ORAL | 0 refills | Status: DC

## 2023-10-29 ENCOUNTER — Encounter: Payer: Self-pay | Admitting: Adult Health

## 2023-10-30 ENCOUNTER — Other Ambulatory Visit: Payer: Self-pay | Admitting: Adult Health

## 2023-10-30 MED ORDER — MOLNUPIRAVIR EUA 200MG CAPSULE
4.0000 | ORAL_CAPSULE | Freq: Two times a day (BID) | ORAL | 0 refills | Status: AC
Start: 2023-10-30 — End: 2023-11-04

## 2023-10-30 NOTE — Telephone Encounter (Signed)
**Note De-identified  [place] Obfuscation** Please advise 

## 2023-11-14 ENCOUNTER — Encounter: Payer: Self-pay | Admitting: Adult Health

## 2023-11-14 NOTE — Telephone Encounter (Signed)
 No I haven't but I will be on the lookout

## 2023-11-14 NOTE — Telephone Encounter (Signed)
 Pt has been scheduled.

## 2023-11-18 ENCOUNTER — Encounter: Payer: Self-pay | Admitting: Adult Health

## 2023-11-18 ENCOUNTER — Ambulatory Visit (INDEPENDENT_AMBULATORY_CARE_PROVIDER_SITE_OTHER): Payer: MEDICAID | Admitting: Adult Health

## 2023-11-18 VITALS — BP 100/68 | HR 100 | Temp 98.2°F | Ht 59.5 in | Wt 129.0 lb

## 2023-11-18 DIAGNOSIS — U071 COVID-19: Secondary | ICD-10-CM | POA: Diagnosis not present

## 2023-11-18 DIAGNOSIS — E66811 Obesity, class 1: Secondary | ICD-10-CM | POA: Diagnosis not present

## 2023-11-18 DIAGNOSIS — E785 Hyperlipidemia, unspecified: Secondary | ICD-10-CM

## 2023-11-18 NOTE — Telephone Encounter (Signed)
 FYI

## 2023-11-18 NOTE — Progress Notes (Signed)
 Subjective:    Patient ID: Grant Grant [name], male    DOB: [birth year], 33 y.o.   MRN: [id number]  HPI 33 year old male who  has a past medical history of Carbuncle and furuncle of unspecified site (08/07/2009), Congenital anomaly of heart (04/01/2008), Down's syndrome, FATIGUE (04/02/2010), Hyperlipidemia, Hypothyroidism, and Routine general medical examination at a health care facility (01/19/2016).  He presents to the office today for follow up due to Covid 19. He is with his guardian Grant Grant [name]) today. Grant Grant [name] tested positive for COVID-19 being via at home test roughly 3 weeks ago.  He had 1 day of low-grade fever up to 99.  Grant Grant [name] reports that he did monitor his blood oxygen levels throughout the COVID infection and blood oxygen never went below 95.  Only when he Grant Grant [name] knew something was wrong with him was he had a decreased appetite.  He was treated with Lagevrio  and tolerated this well.  He has been back to his normal self and is brought in today just to make sure his lungs sound clear.  Grant Grant [name] has not noticed any shortness of breath, wheezing, or coughing   Additionally, has a history of hyperlipidemia and is on a statin.  He is going to [place]  this summer to see his mother and usually puts on weight due to lack of diet when he is visiting his mom.  His caregiver currently has him working out and eating healthy and he has been able to lose roughly 10 pounds over the last 3 months  Wt Readings from Last 10 Encounters:  11/18/23 129 lb (58.5 kg)  08/27/23 139 lb 11.2 oz (63.4 kg)  04/16/23 137 lb (62.1 kg)  02/28/23 141 lb (64 kg)  01/30/23 149 lb (67.6 kg)  06/20/21 128 lb (58.1 kg)  11/03/20 127 lb (57.6 kg)  06/16/20 131 lb (59.4 kg)  09/25/18 131 lb (59.4 kg)  03/13/18 135 lb (61.2 kg)    Review of Systems See HPI   Past Medical History:  Diagnosis Date   Carbuncle and furuncle of unspecified site 08/07/2009   Qualifier: Diagnosis of  By: Grant Harries MD, Grant Grant [name] A    Congenital anomaly  of heart 04/01/2008   Qualifier: Diagnosis of  By: Grant Harries MD, Grant Grant [name]    Down's syndrome    FATIGUE 04/02/2010   Qualifier: Diagnosis of  By: Grant Harries MD, Grant Grant [name]    Hyperlipidemia    Hypothyroidism    Routine general medical examination at a health care facility 01/19/2016    Social History   Socioeconomic History   Marital status: Single    Spouse name: Not on file   Number of children: Not on file   Years of education: Not on file   Highest education level: Not on file  Occupational History   Not on file  Tobacco Use   Smoking status: Never   Smokeless tobacco: Never  Vaping Use   Vaping status: Never Used  Substance and Sexual Activity   Alcohol use: Grant [name]    Alcohol/week: 0.0 standard drinks of alcohol   Drug use: Grant [name]   Sexual activity: Not on file  Other Topics Concern   Not on file  Social History Narrative   Not on file   Social Drivers of Health   Financial Resource Strain: Not on file  Food Grant [name]: Grant Grant [name] (08/27/2023)   Hunger Vital Sign    Worried About Running Out of Food in the Last Year: Never true    Ran Out of  Food in the Last Year: Never true  Transportation Needs: Not on file  Physical Activity: Not on file  Stress: Not on file  Social Connections: Not on file  Intimate Partner Violence: Not on file    Past Surgical History:  Procedure Laterality Date   CARDIAC SURGERY     AV canal VSD repair   HERNIA REPAIR     LUNG SURGERY     drcortication of right lung empyema    Family History  Problem Relation Age of Onset   Hyperlipidemia Mother    Hyperlipidemia Paternal Grandmother    Heart attack Paternal Grandfather     Allergies  Allergen Reactions   Codeine    Doxycycline     Penicillins     Current Outpatient Medications on File Prior to Visit  Medication Sig Dispense Refill   atorvastatin  (LIPITOR) 40 MG tablet TAKE ONE TABLET BY MOUTH DAILY AT 6 PM 90 tablet 0   ketoconazole  (NIZORAL ) 2 % shampoo Apply 1 application  topically 2 (two) times a week. 120 mL 5   levothyroxine  (SYNTHROID ) 88 MCG tablet Take 1 tablet (88 mcg total) by mouth daily. 90 tablet 0   Grant [name] current facility-administered medications on file prior to visit.    BP 100/68   Pulse 100   Temp 98.2 F (36.8 C) (Oral)   Ht 4' 11.5 (1.511 m)   Wt 129 lb (58.5 kg)   SpO2 95%   BMI 25.62 kg/m       Objective:   Physical Exam Vitals and nursing note reviewed.  Constitutional:      General: He is not in acute distress.    Appearance: Normal appearance. He is normal weight. He is not ill-appearing.   Cardiovascular:     Rate and Rhythm: Normal rate and regular rhythm.     Pulses: Normal pulses.     Heart sounds: Normal heart sounds.  Pulmonary:     Effort: Pulmonary effort is normal.     Breath sounds: Normal breath sounds. Grant [name] stridor. Grant [name] wheezing, rhonchi or rales.   Musculoskeletal:        General: Normal range of motion.   Skin:    General: Skin is warm and dry.   Neurological:     Mental Status: He is alert. Mental status is at baseline.   Psychiatric:        Mood and Affect: Mood normal.        Behavior: Behavior normal.        Thought Content: Thought content normal.        Judgment: Judgment normal.        Assessment & Plan:  1. COVID-19 virus infection (Primary) - Lungs completely clear on auscultation.  Grant [name] signs of pneumonia.  I see Grant [name] reason to get a chest x-ray today.  2. Hyperlipidemia, unspecified hyperlipidemia type -Continue with statin.  3. Class 1 obesity -Obesity has resolved with exercise and diet.  Advised caregiver to pass along the message to his mom to keep him on his current diet and attain a healthy lifestyle while he is visiting summer this is in the best interest of Grant [name] and his health.  Grant [place], NP

## 2023-11-28 ENCOUNTER — Encounter: Payer: Self-pay | Admitting: Adult Health

## 2023-11-28 NOTE — Telephone Encounter (Signed)
**Note De-identified  [place] Obfuscation** Please advise 

## 2023-12-19 ENCOUNTER — Other Ambulatory Visit: Payer: Self-pay | Admitting: Adult Health

## 2023-12-19 NOTE — Telephone Encounter (Signed)
 Last tsh on 01/30/2023 and cpe on 01/30/2023.   Attempted to reach pt's guardian to schedule a physical appt. Left a voicemail to call us  back at their convenient.

## 2024-01-12 DIAGNOSIS — H6123 Impacted cerumen, bilateral: Secondary | ICD-10-CM | POA: Insufficient documentation

## 2024-02-16 ENCOUNTER — Telehealth: Payer: Self-pay | Admitting: *Deleted

## 2024-02-16 ENCOUNTER — Other Ambulatory Visit: Payer: Self-pay | Admitting: Adult Health

## 2024-02-16 DIAGNOSIS — E785 Hyperlipidemia, unspecified: Secondary | ICD-10-CM

## 2024-02-16 NOTE — Telephone Encounter (Signed)
 Copied from CRM [id number]. Topic: Clinical - Prescription Issue >> Feb 16, 2024  3:25 PM Macario HERO wrote: Reason for CRM: Patient caregiver Mitchell called said patient is traveling today at 5pm and needs his atorvastatin  (LIPITOR) 40 MG tablet [[id number]] refilled urgently.

## 2024-02-16 NOTE — Telephone Encounter (Signed)
 E2c2 agent will let Grant [name] caregiver know Grant [name] is not in the office today will be back tomorrow

## 2024-02-17 ENCOUNTER — Encounter: Payer: Self-pay | Admitting: Adult Health

## 2024-02-17 DIAGNOSIS — E785 Hyperlipidemia, unspecified: Secondary | ICD-10-CM

## 2024-02-17 MED ORDER — ATORVASTATIN CALCIUM 40 MG PO TABS: ORAL_TABLET | ORAL | 0 refills | Status: DC

## 2024-02-17 NOTE — Telephone Encounter (Signed)
 Spoke to [place] and he stated pt mother will give him a call to see which pharmacy to send Rx to. Awaiting pharmacy.

## 2024-02-27 ENCOUNTER — Encounter: Payer: Self-pay | Admitting: Adult Health

## 2024-03-16 DIAGNOSIS — E039 Hypothyroidism, unspecified: Secondary | ICD-10-CM | POA: Insufficient documentation

## 2024-03-16 NOTE — Progress Notes (Unsigned)
 Cardiology Office Note:    Date:  03/17/2024   ID:  Grant [name], DOB 1991-05-11, MRN [id number]  PCP:  Merna Huxley, NP  Cardiologist:  Redell Leiter, MD    Referring MD: Merna Huxley, NP    ASSESSMENT:    1. Atrioventricular canal type ventricular septal defect   2. DOWNS SYNDROME   3. Mixed hyperlipidemia    PLAN:    In order of problems listed above:  Daviel continues to have a good long-term durable result from his congenital heart disease surgery no residual defect valvular or ASD. Continue to see in the office yearly for any subsequent problems like atrial fibrillation Lipids are at target he will continue his current statin   Next appointment: I will plan to see him in yearly follow-up   Medication Adjustments/Labs and Tests Ordered: Current medicines are reviewed at length with the patient today.  Concerns regarding medicines are outlined above.  Orders Placed This Encounter  Procedures   EKG 12-Lead   No orders of the defined types were placed in this encounter.    History of Present Illness:    [name] is a 33 y.o. male with a hx of Down syndrome and surgical repair of AV canal defect last seen 02/28/2023.  Following the visit had an echocardiogram performed 04/09/2023.  Left ventricle normal in size systolic function GLS and diastolic function in the right ventricle was also normal including TAPSE 1.8.  Both atria are normal in size there is no flow across the atrial ventricular septum and no valvular dysfunction.  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   Atrioventricular canal type  ventricular septal defect 02/26/2017   Bilateral impacted cerumen 01/12/2024   Carbuncle and furuncle 08/07/2009   Qualifier: Diagnosis of   By: Krystal MD, Reyes LABOR     IMO SNOMED Dx Update Oct 2024     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   levothyroxine  (SYNTHROID ) 88 MCG tablet TAKE 1 TABLET BY MOUTH DAILY      EKGs/Labs/Other Studies Reviewed:    The following studies were reviewed today:  Cardiac Studies & Procedures   ______________________________________________________________________________________________     ECHOCARDIOGRAM  ECHOCARDIOGRAM COMPLETE 04/09/2023  Narrative ECHOCARDIOGRAM REPORT    Patient Name:   Grant [name] Date of Exam: 04/09/2023 Medical Rec #:  [id number]       Height:       71.0 in Accession #:    [id number]      Weight:       141.0 lb Date of Birth:  1991-05-24      BSA:  1.817 m Patient Age:    31 years        BP:           116/80 mmHg Patient Gender: M               HR:           60 bpm. Exam Location:  [facility]  Procedure: 2D Echo, Cardiac Doppler, Color Doppler and Strain Analysis  Indications:    Atrioventricular canal type ventricular septal defect [Q21.20 (ICD-10-CM)]; Mixed hyperlipidemia [E78.2 (ICD-10-CM)]; DOWNS SYNDROME [Q90.9 (ICD-10-CM)]  History:        Patient has prior history of Echocardiogram examinations, most recent 03/19/2017. Risk Factors:Dyslipidemia.  Sonographer:    Charlie Jointer RDCS Referring Phys: [id number] Elicia Lui J  Pascual Mantel  IMPRESSIONS   1. Left ventricular ejection fraction, by estimation, is 60 to 65%. The left ventricle has normal function. The left ventricle has no regional wall motion abnormalities. Left ventricular diastolic parameters were normal. GLS-20.1% 2. Right ventricular systolic function is normal. The right ventricular size is normal. 3. The mitral valve is normal in structure. No evidence of mitral valve regurgitation. No evidence of mitral stenosis. 4. The aortic valve is normal in structure. Aortic valve regurgitation is not visualized. No aortic stenosis is present. 5. Patient has history of VSD repair. No convincing evidence of any significant shunt on this study. Clinical correlation suggested. TDS.  FINDINGS Left Ventricle: Left ventricular ejection fraction, by estimation, is 60 to 65%. The left ventricle has normal function. The left ventricle has no regional wall motion abnormalities. The left ventricular internal cavity size was normal in size. There is no left ventricular hypertrophy. Left ventricular diastolic parameters were normal.  Right Ventricle: The right ventricular size is normal. No increase in right ventricular wall thickness. Right ventricular systolic function is normal.  Left Atrium: Left atrial size was normal in size.  Right Atrium: Right atrial size was normal in size.  Pericardium: There is no evidence of pericardial effusion.  Mitral Valve: The mitral valve is normal in structure. No evidence of mitral valve regurgitation. No evidence of mitral valve stenosis.  Tricuspid Valve: The tricuspid valve is normal in structure. Tricuspid valve regurgitation is not demonstrated. No evidence of tricuspid stenosis.  Aortic Valve: The aortic valve is normal in structure. Aortic valve regurgitation is not visualized. No aortic stenosis is present.  Pulmonic Valve: The pulmonic valve was normal in structure. Pulmonic valve regurgitation is not visualized. No  evidence of pulmonic stenosis.  Aorta: The aortic root is normal in size and structure.  Venous: The inferior vena cava is normal in size with greater than 50% respiratory variability, suggesting right atrial pressure of 3 mmHg.  IAS/Shunts: No atrial level shunt detected by color flow Doppler.   LEFT VENTRICLE PLAX 2D LVIDd:         4.00 cm   Diastology LVIDs:         2.80 cm   LV e' medial:    12.40 cm/s LV PW:         0.80 cm   LV E/e' medial:  8.5 LV IVS:        0.80 cm   LV e' lateral:   14.40 cm/s LVOT diam:     2.00 cm   LV E/e' lateral: 7.4 LV SV:         59 LV SV Index:   33 LVOT Area:     3.14 cm   RIGHT VENTRICLE  IVC RV Basal diam:  3.50 cm     IVC diam: 1.10 cm RV Mid diam:    3.00 cm RV S prime:     11.90 cm/s TAPSE (M-mode): 1.8 cm  LEFT ATRIUM             Index        RIGHT ATRIUM           Index LA diam:        3.20 cm 1.76 cm/m   RA Area:     13.90 cm LA Vol (A2C):   29.1 ml 16.01 ml/m  RA Volume:   34.60 ml  19.04 ml/m LA Vol (A4C):   23.1 ml 12.71 ml/m LA Biplane Vol: 26.4 ml 14.53 ml/m AORTIC VALVE LVOT Vmax:   93.55 cm/s LVOT Vmean:  57.550 cm/s LVOT VTI:    0.188 m  AORTA Ao Root diam: 3.10 cm Ao Asc diam:  2.50 cm Ao Desc diam: 1.50 cm  MITRAL VALVE MV Area (PHT): 3.65 cm     SHUNTS MV Decel Time: 208 msec     Systemic VTI:  0.19 m MV E velocity: 106.00 cm/s  Systemic Diam: 2.00 cm MV A velocity: 44.90 cm/s MV E/A ratio:  2.36  Jennifer Crape MD Electronically signed by Jennifer Crape MD Signature Date/Time: 04/09/2023/1:14:30 PM    Final          ______________________________________________________________________________________________      EKG Interpretation Date/Time:  Wednesday March 17 2024 11:00:19 EDT Ventricular Rate:  64 PR Interval:  184 QRS Duration:  88 QT Interval:  402 QTC Calculation: 414 R Axis:   58  Text Interpretation: Normal sinus rhythm Normal ECG When compared with ECG of  28-Feb-2023 10:35, Unchanged Confirmed by Monetta Rogue ([id number]) on 03/17/2024 11:03:27 AM   Recent Labs: 04/16/2023: ALT 12 06/27/2023: BUN 20; Creatinine, Ser 1.26; Potassium 4.1; Sodium 139  Recent Lipid Panel    Component Value Date/Time   CHOL 129 04/16/2023 1142   TRIG 114.0 04/16/2023 1142   HDL 34.60 (L) 04/16/2023 1142   CHOLHDL 4 04/16/2023 1142   VLDL 22.8 04/16/2023 1142   LDLCALC 72 04/16/2023 1142   LDLDIRECT 122.0 01/30/2023 0913    Physical Exam:    VS:  BP 100/70   Pulse 64   Ht 4' 11 (1.499 m)   Wt 141 lb (64 kg)   SpO2 97%   BMI 28.48 kg/m     Wt Readings from Last 3 Encounters:  03/17/24 141 lb (64 kg)  11/18/23 129 lb (58.5 kg)  08/27/23 139 lb 11.2 oz (63.4 kg)     GEN:  Well nourished, well developed in no acute distress typical Down syndrome adult appearance HEENT: Normal NECK: No JVD; No carotid bruits LYMPHATICS: No lymphadenopathy CARDIAC: RRR, no murmurs, rubs, gallops RESPIRATORY:  Clear to auscultation without rales, wheezing or rhonchi  ABDOMEN: Soft, non-tender, non-distended MUSCULOSKELETAL:  No edema; No deformity  SKIN: Warm and dry NEUROLOGIC:  Alert and oriented x 3 PSYCHIATRIC:  Normal affect    Signed, Rogue Monetta, MD  03/17/2024 11:12 AM    [facility]

## 2024-03-17 ENCOUNTER — Ambulatory Visit: Payer: MEDICAID | Attending: Cardiology | Admitting: Cardiology

## 2024-03-17 ENCOUNTER — Encounter: Payer: Self-pay | Admitting: Cardiology

## 2024-03-17 VITALS — BP 100/70 | HR 64 | Ht 59.0 in | Wt 141.0 lb

## 2024-03-17 DIAGNOSIS — Q909 Down syndrome, unspecified: Secondary | ICD-10-CM

## 2024-03-17 DIAGNOSIS — E782 Mixed hyperlipidemia: Secondary | ICD-10-CM | POA: Diagnosis not present

## 2024-03-17 DIAGNOSIS — Q212 Atrioventricular septal defect, unspecified as to partial or complete: Secondary | ICD-10-CM | POA: Diagnosis not present

## 2024-03-17 NOTE — Patient Instructions (Signed)
 Medication Instructions:  None *If you need a refill on your cardiac medications before your next appointment, please call your pharmacy*  Lab Work: None If you have labs (blood work) drawn today and your tests are completely normal, you will receive your results only by: MyChart Message (if you have MyChart) OR A paper copy in the mail If you have any lab test that is abnormal or we need to change your treatment, we will call you to review the results.  Testing/Procedures: None  Follow-Up: At [facility], you and your health needs are our priority.  As part of our continuing mission to provide you with exceptional heart care, our providers are all part of one team.  This team includes your primary Cardiologist (physician) and Advanced Practice Providers or APPs (Physician Assistants and Nurse Practitioners) who all work together to provide you with the care you need, when you need it.  Your next appointment:   1 year(s)  Provider:   Redell Leiter, MD    We recommend signing up for the patient portal called MyChart.  Sign up information is provided on this After Visit Summary.  MyChart is used to connect with patients for Virtual Visits (Telemedicine).  Patients are able to view lab/test results, encounter notes, upcoming appointments, etc.  Non-urgent messages can be sent to your provider as well.   To learn more about what you can do with MyChart, go to [email or web address].   Other Instructions None

## 2024-05-12 ENCOUNTER — Other Ambulatory Visit: Payer: Self-pay | Admitting: Adult Health

## 2024-05-19 NOTE — Telephone Encounter (Signed)
 FYI

## 2024-05-19 NOTE — Telephone Encounter (Signed)
 Pt is transferring to another provider in Feb. Will fill for 90 days

## 2024-06-24 ENCOUNTER — Encounter: Payer: Self-pay | Admitting: Adult Health

## 2024-06-24 DIAGNOSIS — E785 Hyperlipidemia, unspecified: Secondary | ICD-10-CM

## 2024-06-25 MED ORDER — ATORVASTATIN CALCIUM 40 MG PO TABS: ORAL_TABLET | ORAL | 0 refills | Status: AC

## 2024-06-26 ENCOUNTER — Other Ambulatory Visit: Payer: Self-pay | Admitting: Adult Health

## 2024-07-08 ENCOUNTER — Ambulatory Visit: Payer: MEDICAID | Admitting: Adult Health

## 2024-07-08 ENCOUNTER — Encounter: Payer: Self-pay | Admitting: Adult Health

## 2024-07-08 VITALS — BP 102/60 | HR 89 | Temp 98.1°F | Ht 59.0 in | Wt 157.0 lb

## 2024-07-08 DIAGNOSIS — E039 Hypothyroidism, unspecified: Secondary | ICD-10-CM

## 2024-07-08 DIAGNOSIS — Z0000 Encounter for general adult medical examination without abnormal findings: Secondary | ICD-10-CM

## 2024-07-08 DIAGNOSIS — E66811 Obesity, class 1: Secondary | ICD-10-CM

## 2024-07-08 DIAGNOSIS — Q909 Down syndrome, unspecified: Secondary | ICD-10-CM

## 2024-07-08 DIAGNOSIS — Q212 Atrioventricular septal defect, unspecified as to partial or complete: Secondary | ICD-10-CM

## 2024-07-08 DIAGNOSIS — E785 Hyperlipidemia, unspecified: Secondary | ICD-10-CM

## 2024-07-08 DIAGNOSIS — Z23 Encounter for immunization: Secondary | ICD-10-CM

## 2024-07-08 LAB — COMPREHENSIVE METABOLIC PANEL WITH GFR
ALT: 90 U/L — ABNORMAL HIGH (ref 3–53)
AST: 39 U/L — ABNORMAL HIGH (ref 5–37)
Albumin: 4.2 g/dL (ref 3.5–5.2)
Alkaline Phosphatase: 53 U/L (ref 39–117)
BUN: 21 mg/dL (ref 6–23)
CO2: 31 meq/L (ref 19–32)
Calcium: 9 mg/dL (ref 8.4–10.5)
Chloride: 103 meq/L (ref 96–112)
Creatinine, Ser: 1.09 mg/dL (ref 0.40–1.50)
GFR: 89.4 mL/min
Glucose, Bld: 85 mg/dL (ref 70–99)
Potassium: 4.3 meq/L (ref 3.5–5.1)
Sodium: 140 meq/L (ref 135–145)
Total Bilirubin: 0.6 mg/dL (ref 0.2–1.2)
Total Protein: 7.3 g/dL (ref 6.0–8.3)

## 2024-07-08 LAB — CBC
HCT: 41.3 % (ref 39.0–52.0)
Hemoglobin: 14.1 g/dL (ref 13.0–17.0)
MCHC: 34.3 g/dL (ref 30.0–36.0)
MCV: 97.3 fl (ref 78.0–100.0)
Platelets: 246 10*3/uL (ref 150.0–400.0)
RBC: 4.24 Mil/uL (ref 4.22–5.81)
RDW: 14.4 % (ref 11.5–15.5)
WBC: 4.3 10*3/uL (ref 4.0–10.5)

## 2024-07-08 LAB — LIPID PANEL
Cholesterol: 194 mg/dL (ref 28–200)
HDL: 44.7 mg/dL
LDL Cholesterol: 98 mg/dL (ref 10–99)
NonHDL: 149.76
Total CHOL/HDL Ratio: 4
Triglycerides: 258 mg/dL — ABNORMAL HIGH (ref 10.0–149.0)
VLDL: 51.6 mg/dL — ABNORMAL HIGH (ref 0.0–40.0)

## 2024-07-08 LAB — TSH: TSH: 9.38 u[IU]/mL — ABNORMAL HIGH (ref 0.35–5.50)

## 2024-07-08 NOTE — Patient Instructions (Signed)
 It was great seeing you today   We will follow up with you regarding your lab work   Please let me know if you need anything

## 2024-07-08 NOTE — Progress Notes (Signed)
 "  Subjective:    Patient ID: Grant [name], male    DOB: 1991/02/12, 34 y.o.   MRN: [id number]  HPI Patient presents for yearly preventative medicine examination. 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  He presents to the office today with her legal guardian Grant [name]    Hypothyroidism-takes Synthroid  100 mcg daily. Lab Results  Component Value Date   TSH 3.40 01/30/2023   Hyperlipidemia-takes Lipitor 40 mg daily.  He denies myalgia or fatigue.  Lab Results  Component Value Date   CHOL 129 04/16/2023   HDL 34.60 (L) 04/16/2023   LDLCALC 72 04/16/2023   LDLDIRECT 122.0 01/30/2023   TRIG 114.0 04/16/2023   CHOLHDL 4 04/16/2023    H/o 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  He had corrective surgery at 4-1/2 months at the [facility] [place]  and was previously seen at [facility], he was last seen by cardiology at [facility] in October 2025.   Denies activity intolerance, chest pain, shortness of breath  Obesity - he has gained 22 pounds or so, Grant reports that this has been over the last month when living with his mom in [place] . It is known that when he is with his Mom that he does not eat healthy nor does he exercise much. He came back earlier this week and is working on getting back into exercise and eating healthy.   All immunizations and health maintenance protocols were reviewed with the patient and needed orders were placed.  Appropriate screening laboratory values were ordered for the patient  including screening of hyperlipidemia, renal function and hepatic function. If indicated by BPH, a PSA was ordered.  Medication reconciliation,  past medical history, social history, problem list and allergies were reviewed in detail with the patient  Goals were established with regard to weight loss, exercise, and  diet in compliance with medications  Wt Readings from Last 3 Encounters:  07/08/24 157 lb (71.2 kg)  03/17/24 141 lb (64 kg)  11/18/23 129 lb (58.5 kg)   Review of Systems  Constitutional: Negative.   HENT: Negative.    Eyes: Negative.   Respiratory: Negative.    Cardiovascular: Negative.   Gastrointestinal: Negative.   Endocrine: Negative.   Genitourinary: Negative.   Musculoskeletal: Negative.   Skin: Negative.   Allergic/Immunologic: Negative.   Neurological:  Positive for speech difficulty (Non verbal due to history of downs syndrome).  Hematological: Negative.   Psychiatric/Behavioral: Negative.    All other systems reviewed and are negative.  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   Qualifier: Diagnosis of  By: Krystal MD, Reyes LABOR    Down's syndrome    FATIGUE 04/02/2010   Qualifier: Diagnosis of  By: Krystal MD, Reyes A    Hyperlipidemia    Hypothyroidism    Routine general medical examination  at a health care facility 01/19/2016    Social History   Socioeconomic History   Marital status: Single    Spouse name: Not on file   Number of children: Not on file   Years of education: Not on file   Highest education level: Not on file  Occupational History   Not on file  Tobacco Use   Smoking status: Never   Smokeless tobacco: Never   Vaping Use   Vaping status: Never Used  Substance and Sexual Activity   Alcohol use: No    Alcohol/week: 0.0 standard drinks of alcohol   Drug use: No   Sexual activity: Not on file  Other Topics Concern   Not on file  Social History Narrative   Not on file   Social Drivers of Health   Tobacco Use: Low Risk (07/08/2024)   Patient History    Smoking Tobacco Use: Never    Smokeless Tobacco Use: Never    Passive Exposure: Not on file  Financial Resource Strain: Not on file  Food Insecurity: No Food Insecurity (08/27/2023)   Hunger Vital Sign    Worried About Running Out of Food in the Last Year: Never true    Ran Out of Food in the Last Year: Never true  Transportation Needs: Not on file  Physical Activity: Not on file  Stress: Not on file  Social Connections: Not on file  Intimate Partner Violence: Not on file  Depression (PHQ2-9): Not on file  Alcohol Screen: Not on file  Housing: Not on file  Utilities: Not on file  Health Literacy: Not on file    Past Surgical History:  Procedure Laterality Date   CARDIAC SURGERY     AV canal VSD repair   HERNIA REPAIR     LUNG SURGERY     drcortication of right lung empyema    Family History  Problem Relation Age of Onset   Hyperlipidemia Mother    Hyperlipidemia Paternal Grandmother    Heart attack Paternal Grandfather     Allergies[1]  Medications Ordered Prior to Encounter[2]  BP 102/60   Pulse 89   Temp 98.1 F (36.7 C) (Oral)   Ht 4' 11 (1.499 m)   Wt 157 lb (71.2 kg)   SpO2 97%   BMI 31.71 kg/m       Objective:   Physical Exam Vitals and nursing note reviewed.  Constitutional:      General: He is not in acute distress.    Appearance: Normal appearance. He is not ill-appearing.  HENT:     Head: Normocephalic and atraumatic.     Right Ear: Tympanic membrane, ear canal and external ear normal. There is no impacted cerumen.     Left Ear: Tympanic membrane, ear canal and external ear normal. There is no  impacted cerumen.     Nose: Nose normal. No congestion or rhinorrhea.     Mouth/Throat:     Mouth: Mucous membranes are moist.     Pharynx: Oropharynx is clear.  Eyes:     Extraocular Movements: Extraocular movements intact.     Conjunctiva/sclera: Conjunctivae normal.     Pupils: Pupils are equal, round, and reactive to light.  Neck:     Vascular: No carotid bruit.  Cardiovascular:     Rate and Rhythm: Normal rate and regular rhythm.     Pulses: Normal pulses.     Heart sounds: No murmur heard.    No friction rub. No gallop.  Pulmonary:  Effort: Pulmonary effort is normal.     Breath sounds: Normal breath sounds.  Abdominal:     General: Abdomen is flat. Bowel sounds are normal. There is no distension.     Palpations: Abdomen is soft. There is no mass.     Tenderness: There is no abdominal tenderness. There is no guarding or rebound.     Hernia: No hernia is present.  Musculoskeletal:        General: Normal range of motion.     Cervical back: Normal range of motion and neck supple.  Lymphadenopathy:     Cervical: No cervical adenopathy.  Skin:    General: Skin is warm and dry.     Capillary Refill: Capillary refill takes less than 2 seconds.  Neurological:     General: No focal deficit present.     Mental Status: He is alert and oriented to person, place, and time.  Psychiatric:        Mood and Affect: Mood normal.        Behavior: Behavior normal.        Thought Content: Thought content normal.        Judgment: Judgment normal.        Assessment & Plan:  1. Routine general medical examination at a health care facility (Primary) Today patient counseled on age appropriate routine health concerns for screening and prevention, each reviewed and up to date or declined. Immunizations reviewed and up to date or declined. Labs ordered and reviewed. Risk factors for depression reviewed and negative. Hearing function and visual acuity are intact. ADLs screened and addressed  as needed. Functional ability and level of safety reviewed and appropriate. Education, counseling and referrals performed based on assessed risks today. Patient provided with a copy of personalized plan for preventive services. Grant [name] is going to be transferring care from me to Dr. Mercer, as his mom would feel more comfortable with him being seen by an MD. I am sad to see him go and it has been a pleasure taking care of him for the last 10 years.   2. Acquired hypothyroidism - Recheck TSH today. It does not sound like he was given his medication routinely while he was up in [place]  .  - Lipid panel; Future - TSH; Future - CBC; Future - Comprehensive metabolic panel with GFR; Future  3. Hyperlipidemia, unspecified hyperlipidemia type - Continue with statin  - Lipid panel; Future - TSH; Future - CBC; Future - Comprehensive metabolic panel with GFR; Future  4. Atrioventricular canal type ventricular septal defect - Stable. Advised his caregiver that rapid weight gain like he experiences when he goes to [place]  is not good for his heart - Lipid panel; Future - TSH; Future - CBC; Future - Comprehensive metabolic panel with GFR; Future  5. DOWNS SYNDROME - Stable.   6. Class 1 obesity Unfortunately, this is a reoccurring issues. As in the past I know that Grant will be able to  get his weight back down and make sure he is eating healthy and staying active    7. Need for pneumococcal vaccine  - Pneumococcal conjugate vaccine 20-valent (Prevnar 20)     [1]  Allergies Allergen Reactions   Codeine    Doxycycline     Penicillins   [2]  Current Outpatient Medications on File Prior to Visit  Medication Sig Dispense Refill   atorvastatin  (LIPITOR) 40 MG tablet TAKE ONE TABLET BY MOUTH DAILY AT 6 PM 90 tablet 0   ketoconazole  (  NIZORAL ) 2 % shampoo Apply 1 application topically 2 (two) times a week. 120 mL 5   levothyroxine  (SYNTHROID ) 88 MCG tablet TAKE 1 TABLET BY MOUTH EVERY  DAY 90 tablet 3   No current facility-administered medications on file prior to visit.   "

## 2024-07-09 ENCOUNTER — Ambulatory Visit: Payer: Self-pay | Admitting: Adult Health

## 2024-07-09 DIAGNOSIS — R748 Abnormal levels of other serum enzymes: Secondary | ICD-10-CM

## 2024-07-09 DIAGNOSIS — E782 Mixed hyperlipidemia: Secondary | ICD-10-CM

## 2024-07-09 DIAGNOSIS — E039 Hypothyroidism, unspecified: Secondary | ICD-10-CM

## 2024-08-03 ENCOUNTER — Other Ambulatory Visit: Payer: MEDICAID

## 2024-08-05 ENCOUNTER — Encounter: Payer: Self-pay | Admitting: Family Medicine
